# Patient Record
Sex: Female | Born: 1935 | Race: White | Hispanic: No | Smoking: Never smoker
Health system: Southern US, Community
[De-identification: ages and names within clinical notes are randomized; demographics above are authoritative.]

## PROBLEM LIST (undated history)

## (undated) DIAGNOSIS — I2510 Atherosclerotic heart disease of native coronary artery without angina pectoris: Secondary | ICD-10-CM

## (undated) HISTORY — PX: APPENDECTOMY: SHX54

## (undated) HISTORY — DX: Atherosclerotic heart disease of native coronary artery without angina pectoris: I25.10

## (undated) HISTORY — PX: CHOLECYSTECTOMY: SHX55

---

## 2002-10-28 ENCOUNTER — Other Ambulatory Visit: Admission: RE | Admit: 2002-10-28 | Discharge: 2002-10-28 | Payer: Self-pay | Admitting: Family Medicine

## 2008-11-07 ENCOUNTER — Other Ambulatory Visit: Admission: RE | Admit: 2008-11-07 | Discharge: 2008-11-07 | Payer: Self-pay | Admitting: Family Medicine

## 2012-02-11 ENCOUNTER — Other Ambulatory Visit: Payer: Self-pay | Admitting: Family Medicine

## 2012-02-11 ENCOUNTER — Ambulatory Visit
Admission: RE | Admit: 2012-02-11 | Discharge: 2012-02-11 | Disposition: A | Discharge status: Home / Self Care | Payer: PRIVATE HEALTH INSURANCE | Source: Ambulatory Visit | Attending: Family Medicine | Admitting: Family Medicine

## 2012-02-11 DIAGNOSIS — G453 Amaurosis fugax: Secondary | ICD-10-CM

## 2012-02-12 ENCOUNTER — Other Ambulatory Visit: Payer: Self-pay | Admitting: Family Medicine

## 2012-02-12 DIAGNOSIS — E041 Nontoxic single thyroid nodule: Secondary | ICD-10-CM

## 2012-02-18 ENCOUNTER — Other Ambulatory Visit: Payer: Self-pay | Admitting: Family Medicine

## 2012-02-18 DIAGNOSIS — H3400 Transient retinal artery occlusion, unspecified eye: Secondary | ICD-10-CM

## 2012-02-19 ENCOUNTER — Ambulatory Visit
Admission: RE | Admit: 2012-02-19 | Discharge: 2012-02-19 | Disposition: A | Discharge status: Home / Self Care | Payer: PRIVATE HEALTH INSURANCE | Source: Ambulatory Visit | Attending: Family Medicine | Admitting: Family Medicine

## 2012-02-19 DIAGNOSIS — E041 Nontoxic single thyroid nodule: Secondary | ICD-10-CM

## 2012-02-24 ENCOUNTER — Ambulatory Visit
Admission: RE | Admit: 2012-02-24 | Discharge: 2012-02-24 | Disposition: A | Discharge status: Home / Self Care | Payer: PRIVATE HEALTH INSURANCE | Source: Ambulatory Visit | Attending: Family Medicine | Admitting: Family Medicine

## 2012-02-24 DIAGNOSIS — H3400 Transient retinal artery occlusion, unspecified eye: Secondary | ICD-10-CM

## 2012-03-01 ENCOUNTER — Other Ambulatory Visit: Payer: Self-pay | Admitting: Family Medicine

## 2012-03-01 DIAGNOSIS — E041 Nontoxic single thyroid nodule: Secondary | ICD-10-CM

## 2012-03-16 ENCOUNTER — Ambulatory Visit
Admission: RE | Admit: 2012-03-16 | Discharge: 2012-03-16 | Disposition: A | Discharge status: Home / Self Care | Payer: PRIVATE HEALTH INSURANCE | Source: Ambulatory Visit | Attending: Family Medicine | Admitting: Family Medicine

## 2012-03-16 ENCOUNTER — Other Ambulatory Visit (HOSPITAL_COMMUNITY)
Admission: RE | Admit: 2012-03-16 | Discharge: 2012-03-16 | Disposition: A | Discharge status: Home / Self Care | Payer: PRIVATE HEALTH INSURANCE | Source: Ambulatory Visit | Attending: Interventional Radiology | Admitting: Interventional Radiology

## 2012-03-16 DIAGNOSIS — E049 Nontoxic goiter, unspecified: Secondary | ICD-10-CM | POA: Insufficient documentation

## 2012-03-16 DIAGNOSIS — E041 Nontoxic single thyroid nodule: Secondary | ICD-10-CM

## 2014-10-10 ENCOUNTER — Encounter: Payer: Self-pay | Admitting: *Deleted

## 2014-10-11 ENCOUNTER — Encounter: Payer: Self-pay | Admitting: *Deleted

## 2016-12-25 DIAGNOSIS — Z961 Presence of intraocular lens: Secondary | ICD-10-CM | POA: Diagnosis not present

## 2016-12-25 DIAGNOSIS — H43393 Other vitreous opacities, bilateral: Secondary | ICD-10-CM | POA: Diagnosis not present

## 2016-12-25 DIAGNOSIS — H524 Presbyopia: Secondary | ICD-10-CM | POA: Diagnosis not present

## 2016-12-25 DIAGNOSIS — H26492 Other secondary cataract, left eye: Secondary | ICD-10-CM | POA: Diagnosis not present

## 2016-12-25 DIAGNOSIS — H04123 Dry eye syndrome of bilateral lacrimal glands: Secondary | ICD-10-CM | POA: Diagnosis not present

## 2017-02-25 ENCOUNTER — Encounter (HOSPITAL_COMMUNITY): Payer: Self-pay | Admitting: Emergency Medicine

## 2017-02-25 ENCOUNTER — Emergency Department (HOSPITAL_COMMUNITY): Payer: Medicare HMO

## 2017-02-25 ENCOUNTER — Emergency Department (HOSPITAL_COMMUNITY)
Admission: EM | Admit: 2017-02-25 | Discharge: 2017-02-26 | Disposition: A | Discharge status: Home / Self Care | Payer: Medicare HMO | Source: Home / Self Care | Attending: Emergency Medicine | Admitting: Emergency Medicine

## 2017-02-25 DIAGNOSIS — I2510 Atherosclerotic heart disease of native coronary artery without angina pectoris: Secondary | ICD-10-CM | POA: Insufficient documentation

## 2017-02-25 DIAGNOSIS — Y92410 Unspecified street and highway as the place of occurrence of the external cause: Secondary | ICD-10-CM

## 2017-02-25 DIAGNOSIS — Y999 Unspecified external cause status: Secondary | ICD-10-CM

## 2017-02-25 DIAGNOSIS — Y939 Activity, unspecified: Secondary | ICD-10-CM

## 2017-02-25 DIAGNOSIS — M545 Low back pain: Secondary | ICD-10-CM | POA: Diagnosis not present

## 2017-02-25 DIAGNOSIS — M5489 Other dorsalgia: Secondary | ICD-10-CM | POA: Diagnosis not present

## 2017-02-25 DIAGNOSIS — S39012A Strain of muscle, fascia and tendon of lower back, initial encounter: Secondary | ICD-10-CM | POA: Diagnosis not present

## 2017-02-25 DIAGNOSIS — T148XXA Other injury of unspecified body region, initial encounter: Secondary | ICD-10-CM | POA: Diagnosis not present

## 2017-02-25 MED ORDER — HYDROCODONE-ACETAMINOPHEN 5-325 MG PO TABS
1.0000 | ORAL_TABLET | Freq: Once | ORAL | Status: AC
  Administered 2017-02-25: 1 via ORAL
  Filled 2017-02-25: qty 1

## 2017-02-25 MED ORDER — IBUPROFEN 200 MG PO TABS
600.0000 mg | ORAL_TABLET | Freq: Once | ORAL | Status: AC
  Administered 2017-02-25: 600 mg via ORAL
  Filled 2017-02-25: qty 3

## 2017-02-25 MED ORDER — LORAZEPAM 0.5 MG PO TABS
0.5000 mg | ORAL_TABLET | Freq: Once | ORAL | Status: AC
  Administered 2017-02-25: 0.5 mg via ORAL
  Filled 2017-02-25: qty 1
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
## 2017-02-25 NOTE — ED Notes (Signed)
Oxygen saturation 85-88% on room air  Oxygen applied at 2 L/min via [place]

## 2017-02-26 ENCOUNTER — Inpatient Hospital Stay (HOSPITAL_COMMUNITY)
Admission: EM | Admit: 2017-02-26 | Discharge: 2017-03-05 | DRG: 551 | Disposition: A | Discharge status: Skilled Nursing Facility | Payer: Medicare HMO | Attending: Emergency Medicine | Admitting: Emergency Medicine

## 2017-02-26 ENCOUNTER — Encounter (HOSPITAL_COMMUNITY): Payer: Self-pay | Admitting: Emergency Medicine

## 2017-02-26 ENCOUNTER — Emergency Department (HOSPITAL_COMMUNITY): Payer: Medicare HMO

## 2017-02-26 DIAGNOSIS — S32000A Wedge compression fracture of unspecified lumbar vertebra, initial encounter for closed fracture: Secondary | ICD-10-CM

## 2017-02-26 DIAGNOSIS — I2510 Atherosclerotic heart disease of native coronary artery without angina pectoris: Secondary | ICD-10-CM | POA: Diagnosis present

## 2017-02-26 DIAGNOSIS — S39012A Strain of muscle, fascia and tendon of lower back, initial encounter: Secondary | ICD-10-CM | POA: Diagnosis not present

## 2017-02-26 DIAGNOSIS — R918 Other nonspecific abnormal finding of lung field: Secondary | ICD-10-CM | POA: Diagnosis not present

## 2017-02-26 DIAGNOSIS — R52 Pain, unspecified: Secondary | ICD-10-CM | POA: Diagnosis not present

## 2017-02-26 DIAGNOSIS — M79671 Pain in right foot: Secondary | ICD-10-CM | POA: Diagnosis not present

## 2017-02-26 DIAGNOSIS — S32492A Other specified fracture of left acetabulum, initial encounter for closed fracture: Secondary | ICD-10-CM | POA: Diagnosis not present

## 2017-02-26 DIAGNOSIS — S32402A Unspecified fracture of left acetabulum, initial encounter for closed fracture: Secondary | ICD-10-CM | POA: Diagnosis not present

## 2017-02-26 DIAGNOSIS — N390 Urinary tract infection, site not specified: Secondary | ICD-10-CM | POA: Diagnosis not present

## 2017-02-26 DIAGNOSIS — S32039A Unspecified fracture of third lumbar vertebra, initial encounter for closed fracture: Secondary | ICD-10-CM | POA: Diagnosis not present

## 2017-02-26 DIAGNOSIS — M419 Scoliosis, unspecified: Secondary | ICD-10-CM | POA: Diagnosis present

## 2017-02-26 DIAGNOSIS — S3210XD Unspecified fracture of sacrum, subsequent encounter for fracture with routine healing: Secondary | ICD-10-CM

## 2017-02-26 DIAGNOSIS — M545 Low back pain: Secondary | ICD-10-CM | POA: Diagnosis not present

## 2017-02-26 DIAGNOSIS — S3210XA Unspecified fracture of sacrum, initial encounter for closed fracture: Secondary | ICD-10-CM | POA: Diagnosis not present

## 2017-02-26 DIAGNOSIS — M47818 Spondylosis without myelopathy or radiculopathy, sacral and sacrococcygeal region: Secondary | ICD-10-CM | POA: Diagnosis not present

## 2017-02-26 DIAGNOSIS — S3993XA Unspecified injury of pelvis, initial encounter: Secondary | ICD-10-CM | POA: Diagnosis not present

## 2017-02-26 DIAGNOSIS — S32591A Other specified fracture of right pubis, initial encounter for closed fracture: Secondary | ICD-10-CM | POA: Diagnosis present

## 2017-02-26 DIAGNOSIS — D62 Acute posthemorrhagic anemia: Secondary | ICD-10-CM

## 2017-02-26 DIAGNOSIS — M47816 Spondylosis without myelopathy or radiculopathy, lumbar region: Secondary | ICD-10-CM | POA: Diagnosis present

## 2017-02-26 DIAGNOSIS — S32059A Unspecified fracture of fifth lumbar vertebra, initial encounter for closed fracture: Secondary | ICD-10-CM | POA: Diagnosis not present

## 2017-02-26 DIAGNOSIS — S32511A Fracture of superior rim of right pubis, initial encounter for closed fracture: Secondary | ICD-10-CM | POA: Diagnosis not present

## 2017-02-26 DIAGNOSIS — Z880 Allergy status to penicillin: Secondary | ICD-10-CM | POA: Diagnosis not present

## 2017-02-26 DIAGNOSIS — S32030A Wedge compression fracture of third lumbar vertebra, initial encounter for closed fracture: Secondary | ICD-10-CM | POA: Diagnosis not present

## 2017-02-26 DIAGNOSIS — S99911A Unspecified injury of right ankle, initial encounter: Secondary | ICD-10-CM | POA: Diagnosis not present

## 2017-02-26 DIAGNOSIS — Y92410 Unspecified street and highway as the place of occurrence of the external cause: Secondary | ICD-10-CM

## 2017-02-26 DIAGNOSIS — S99921A Unspecified injury of right foot, initial encounter: Secondary | ICD-10-CM | POA: Diagnosis not present

## 2017-02-26 DIAGNOSIS — M25571 Pain in right ankle and joints of right foot: Secondary | ICD-10-CM | POA: Diagnosis not present

## 2017-02-26 DIAGNOSIS — S299XXA Unspecified injury of thorax, initial encounter: Secondary | ICD-10-CM | POA: Diagnosis not present

## 2017-02-26 DIAGNOSIS — M549 Dorsalgia, unspecified: Secondary | ICD-10-CM

## 2017-02-26 LAB — URINALYSIS, ROUTINE W REFLEX MICROSCOPIC
Bilirubin Urine: NEGATIVE
GLUCOSE, UA: NEGATIVE mg/dL
KETONES UR: NEGATIVE mg/dL
Leukocytes, UA: NEGATIVE
Nitrite: POSITIVE — AB
PH: 5 (ref 5.0–8.0)
Protein, ur: NEGATIVE mg/dL
SPECIFIC GRAVITY, URINE: 1.023 (ref 1.005–1.030)

## 2017-02-26 LAB — I-STAT CHEM 8, ED
BUN: 32 mg/dL — ABNORMAL HIGH (ref 6–20)
CALCIUM ION: 1.16 mmol/L (ref 1.15–1.40)
CHLORIDE: 104 mmol/L (ref 101–111)
CREATININE: 0.9 mg/dL (ref 0.44–1.00)
GLUCOSE: 127 mg/dL — AB (ref 65–99)
HCT: 40 % (ref 36.0–46.0)
Hemoglobin: 13.6 g/dL (ref 12.0–15.0)
POTASSIUM: 4.3 mmol/L (ref 3.5–5.1)
Sodium: 137 mmol/L (ref 135–145)
TCO2: 23 mmol/L (ref 0–100)

## 2017-02-26 LAB — CBC WITH DIFFERENTIAL/PLATELET
BASOS ABS: 0 10*3/uL (ref 0.0–0.1)
Basophils Relative: 0 %
EOS PCT: 0 %
Eosinophils Absolute: 0 10*3/uL (ref 0.0–0.7)
HCT: 40.7 % (ref 36.0–46.0)
HEMOGLOBIN: 13.6 g/dL (ref 12.0–15.0)
LYMPHS ABS: 1 10*3/uL (ref 0.7–4.0)
LYMPHS PCT: 8 %
MCH: 30.4 pg (ref 26.0–34.0)
MCHC: 33.4 g/dL (ref 30.0–36.0)
MCV: 90.8 fL (ref 78.0–100.0)
Monocytes Absolute: 0.6 10*3/uL (ref 0.1–1.0)
Monocytes Relative: 4 %
NEUTROS PCT: 88 %
Neutro Abs: 11.5 10*3/uL — ABNORMAL HIGH (ref 1.7–7.7)
Platelets: 190 10*3/uL (ref 150–400)
RBC: 4.48 MIL/uL (ref 3.87–5.11)
RDW: 13.6 % (ref 11.5–15.5)
WBC: 13.1 10*3/uL — ABNORMAL HIGH (ref 4.0–10.5)

## 2017-02-26 LAB — COMPREHENSIVE METABOLIC PANEL
ALK PHOS: 43 U/L (ref 38–126)
ALT: 50 U/L (ref 14–54)
AST: 70 U/L — AB (ref 15–41)
Albumin: 3.6 g/dL (ref 3.5–5.0)
Anion gap: 10 (ref 5–15)
BUN: 30 mg/dL — AB (ref 6–20)
CALCIUM: 9.2 mg/dL (ref 8.9–10.3)
CHLORIDE: 104 mmol/L (ref 101–111)
CO2: 23 mmol/L (ref 22–32)
CREATININE: 0.9 mg/dL (ref 0.44–1.00)
GFR calc Af Amer: >60 mL/min (ref >60)
GFR, EST NON AFRICAN AMERICAN: 58 mL/min — AB (ref >60)
Glucose, Bld: 128 mg/dL — ABNORMAL HIGH (ref 65–99)
Potassium: 4.3 mmol/L (ref 3.5–5.1)
SODIUM: 137 mmol/L (ref 135–145)
Total Bilirubin: 1.3 mg/dL — ABNORMAL HIGH (ref 0.3–1.2)
Total Protein: 7 g/dL (ref 6.5–8.1)

## 2017-02-26 LAB — I-STAT TROPONIN, ED: Troponin i, poc: 0.01 ng/mL (ref 0.00–0.08)

## 2017-02-26 MED ORDER — SODIUM CHLORIDE 0.9 % IV BOLUS (SEPSIS)
1000.0000 mL | Freq: Once | INTRAVENOUS | Status: AC
  Administered 2017-02-26: 1000 mL via INTRAVENOUS

## 2017-02-26 MED ORDER — ONDANSETRON HCL 4 MG/2ML IJ SOLN: 4.0000 mg | Freq: Four times a day (QID) | INTRAMUSCULAR | Status: DC | PRN

## 2017-02-26 MED ORDER — IOPAMIDOL (ISOVUE-300) INJECTION 61%
INTRAVENOUS | Status: AC
Start: 2017-02-26 — End: 2017-02-26
  Administered 2017-02-26: 100 mL
  Filled 2017-02-26: qty 100

## 2017-02-26 MED ORDER — MORPHINE SULFATE (PF) 4 MG/ML IV SOLN
1.0000 mg | INTRAVENOUS | Status: DC | PRN
  Administered 2017-02-26: 1 mg via INTRAVENOUS
  Administered 2017-02-27 (×2): 2 mg via INTRAVENOUS
  Administered 2017-02-27: 1 mg via INTRAVENOUS
  Filled 2017-02-26 (×4): qty 1

## 2017-02-26 MED ORDER — DIPHENHYDRAMINE HCL 50 MG/ML IJ SOLN: 12.5000 mg | Freq: Four times a day (QID) | INTRAMUSCULAR | Status: DC | PRN

## 2017-02-26 MED ORDER — DIPHENHYDRAMINE HCL 12.5 MG/5ML PO ELIX: 12.5000 mg | ORAL_SOLUTION | Freq: Four times a day (QID) | ORAL | Status: DC | PRN

## 2017-02-26 MED ORDER — ONDANSETRON 4 MG PO TBDP
4.0000 mg | ORAL_TABLET | Freq: Four times a day (QID) | ORAL | Status: DC | PRN
  Filled 2017-02-26: qty 1

## 2017-02-26 MED ORDER — FENTANYL CITRATE (PF) 100 MCG/2ML IJ SOLN
25.0000 ug | Freq: Once | INTRAMUSCULAR | Status: AC
  Administered 2017-02-26: 25 ug via INTRAVENOUS
  Filled 2017-02-26: qty 2

## 2017-02-26 MED ORDER — SODIUM CHLORIDE 0.9 % IV SOLN
INTRAVENOUS | Status: DC
  Administered 2017-02-26 – 2017-02-28 (×2): via INTRAVENOUS

## 2017-02-26 MED ORDER — PANTOPRAZOLE SODIUM 40 MG IV SOLR
40.0000 mg | Freq: Every day | INTRAVENOUS | Status: DC
  Administered 2017-02-26 – 2017-02-27 (×2): 40 mg via INTRAVENOUS
  Filled 2017-02-26 (×2): qty 40

## 2017-02-26 MED ORDER — TRAMADOL HCL 50 MG PO TABS: 50.0000 mg | ORAL_TABLET | Freq: Four times a day (QID) | ORAL | 0 refills | Status: DC | PRN

## 2017-02-26 NOTE — ED Notes (Signed)
Attempted to insert foley but had resistance.  Stopped advancing.
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
## 2017-02-26 NOTE — ED Notes (Signed)
Shannon Merritt (daughter) [phone]
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
## 2017-02-26 NOTE — ED Notes (Signed)
Patient transported to MRI 
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
## 2017-02-26 NOTE — ED Notes (Addendum)
Unsuccessful foley placement attempt x2.
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
## 2017-02-26 NOTE — ED Notes (Signed)
Daughter took all the patient's personal belongings home, including cell phone.

## 2017-02-26 NOTE — ED Notes (Signed)
Patient leaving with [facility].  
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
## 2017-02-26 NOTE — ED Notes (Signed)
[facility] called for transport, possible 2hr weight.

## 2017-02-26 NOTE — ED Notes (Signed)
Report given to Rachel Bo, Therapist, sports at [facility].

## 2017-02-26 NOTE — ED Notes (Signed)
Attempted to call report, charge nurse Cybill, RN requested we call back after 7pm.

## 2017-02-27 ENCOUNTER — Inpatient Hospital Stay (HOSPITAL_COMMUNITY): Payer: Medicare HMO

## 2017-02-27 LAB — CBC
HEMATOCRIT: 36.8 % (ref 36.0–46.0)
Hemoglobin: 12.2 g/dL (ref 12.0–15.0)
MCH: 30.1 pg (ref 26.0–34.0)
MCHC: 33.2 g/dL (ref 30.0–36.0)
MCV: 90.9 fL (ref 78.0–100.0)
PLATELETS: 169 10*3/uL (ref 150–400)
RBC: 4.05 MIL/uL (ref 3.87–5.11)
RDW: 13.9 % (ref 11.5–15.5)
WBC: 11.3 10*3/uL — AB (ref 4.0–10.5)

## 2017-02-27 LAB — BASIC METABOLIC PANEL
Anion gap: 9 (ref 5–15)
BUN: 19 mg/dL (ref 6–20)
CALCIUM: 8.2 mg/dL — AB (ref 8.9–10.3)
CO2: 21 mmol/L — ABNORMAL LOW (ref 22–32)
Chloride: 106 mmol/L (ref 101–111)
Creatinine, Ser: 0.74 mg/dL (ref 0.44–1.00)
GFR calc Af Amer: >60 mL/min (ref >60)
GLUCOSE: 123 mg/dL — AB (ref 65–99)
POTASSIUM: 3.9 mmol/L (ref 3.5–5.1)
SODIUM: 136 mmol/L (ref 135–145)

## 2017-02-27 LAB — APTT: APTT: 37 s — AB (ref 24–36)

## 2017-02-27 LAB — PROTIME-INR
INR: 1.18
PROTHROMBIN TIME: 15 s (ref 11.4–15.2)

## 2017-02-27 LAB — MAGNESIUM: MAGNESIUM: 1.9 mg/dL (ref 1.7–2.4)

## 2017-02-27 LAB — MRSA PCR SCREENING: MRSA BY PCR: NEGATIVE

## 2017-02-27 MED ORDER — TRAMADOL HCL 50 MG PO TABS
50.0000 mg | ORAL_TABLET | Freq: Four times a day (QID) | ORAL | Status: DC | PRN
  Administered 2017-02-28 – 2017-03-05 (×12): 50 mg via ORAL
  Filled 2017-02-27 (×12): qty 1

## 2017-02-27 MED ORDER — MULTI-VITAMIN/MINERALS PO TABS: 1.0000 | ORAL_TABLET | Freq: Every day | ORAL | Status: DC

## 2017-02-27 MED ORDER — ADULT MULTIVITAMIN W/MINERALS CH
1.0000 | ORAL_TABLET | Freq: Every day | ORAL | Status: DC
  Administered 2017-02-28 – 2017-03-05 (×6): 1 via ORAL
  Filled 2017-02-27 (×6): qty 1

## 2017-02-27 NOTE — Progress Notes (Signed)
Pt transferred via [place] from [place] to [place] after report.  Pt arrives A&Ox4 and has been instructed on call bell and surroundings.
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
## 2017-02-27 NOTE — Progress Notes (Signed)
Subjective: C/o right ankle/foot pain laterally. Not OOB yet. Some right hip pain.  Objective: Vital signs in last 24 hours: Temp:  [97.8 F (36.6 C)-99.9 F (37.7 C)] 99.4 F (37.4 C) (05/18 0748) Pulse Rate:  [78-117] 117 (05/18 0552) Resp:  [14-20] 18 (05/18 0552) BP: (111-145)/(57-86) 111/86 (05/18 0748) SpO2:  [93 %-100 %] 95 % (05/18 0748) Weight:  [86.8 kg (191 lb 5.8 oz)] 86.8 kg (191 lb 5.8 oz) (05/17 2100)  Intake/Output from previous day: 05/17 0701 - 05/18 0700 In: 2002.5 [I.V.:1002.5; IV Piggyback:1000] Out: 550 [Urine:550] Intake/Output this shift: Total I/O In: 875 [P.O.:800; I.V.:75] Out: 100 [Urine:100]   Recent Labs  02/26/17 1322 02/26/17 1335 02/27/17 0417  HGB 13.6 13.6 12.2    Recent Labs  02/26/17 1322 02/26/17 1335 02/27/17 0417  WBC 13.1*  --  11.3*  RBC 4.48  --  4.05  HCT 40.7 40.0 36.8  PLT 190  --  169    Recent Labs  02/26/17 1322 02/26/17 1335 02/27/17 0417  NA 137 137 136  K 4.3 4.3 3.9  CL 104 104 106  CO2 23  --  21*  BUN 30* 32* 19  CREATININE 0.90 0.90 0.74  GLUCOSE 128* 127* 123*  CALCIUM 9.2  --  8.2*    Recent Labs  02/27/17 0417  INR 1.18  Right foot/ankle exam: Moderate tenderness over cuboid/lateral foot/ankle. FROM Minimal pain with ROM of right and left hips. N-V intact to bilat LE's.    Assessment/Plan: !. Right inferior/superior pubic rami fx's 2.Right sacral fx 3.Non displaced Left acetabular fx 4. Right foot/ankle pain  Plan: Mobilize pt over weekend   WBAT bilat LE's Will order xrays of right foot/ankle. Dr Romero Liner PA-C on call over weekend. If unable to mobilize then will consult Dr Marcelino Scot on Monday.(see Dr Jackalyn Lombard consult note for details)   Erlene Senters 02/27/2017, 9:51 AM

## 2017-02-28 MED ORDER — PANTOPRAZOLE SODIUM 40 MG PO TBEC
40.0000 mg | DELAYED_RELEASE_TABLET | Freq: Every day | ORAL | Status: DC
  Administered 2017-02-28 – 2017-03-04 (×5): 40 mg via ORAL
  Filled 2017-02-28 (×5): qty 1

## 2017-02-28 MED ORDER — ENOXAPARIN SODIUM 40 MG/0.4ML ~~LOC~~ SOLN
40.0000 mg | SUBCUTANEOUS | Status: DC
Start: 2017-02-28 — End: 2017-03-05
  Administered 2017-02-28 – 2017-03-05 (×6): 40 mg via SUBCUTANEOUS
  Filled 2017-02-28 (×6): qty 0.4

## 2017-02-28 MED ORDER — ACETAMINOPHEN 325 MG PO TABS
650.0000 mg | ORAL_TABLET | Freq: Four times a day (QID) | ORAL | Status: DC | PRN
  Administered 2017-03-02: 650 mg via ORAL
  Filled 2017-02-28: qty 2

## 2017-02-28 NOTE — Progress Notes (Signed)
PATIENT ID: Shannon Merritt  MRN: [id number]  DOB/AGE:  Jul 18, 1933 / 83 y.o.        PROGRESS NOTE Subjective:   Patient is alert, oriented, no Nausea, no Vomiting, yes passing gas, no Bowel Movement. Taking PO well. Denies SOB, Chest or Calf Pain. Using Incentive Spirometer, PAS in place. Ambulate WBAT with pt on bedside commode when I entered room, Patient reports pain as 2 on 0-10 scale at rest,     Objective: Vital signs in last 24 hours: Temp:  [98.6 F (37 C)-99.5 F (37.5 C)] 99.1 F (37.3 C) (05/19 0627) Pulse Rate:  [86-98] 86 (05/19 0627) Resp:  [17-20] 20 (05/19 0627) BP: (128-145)/(50-65) 145/65 (05/19 0627) SpO2:  [89 %-99 %] 95 % (05/19 0627)    Intake/Output from previous day: I/O last 3 completed shifts: In: 1877.5 [P.O.:800; I.V.:1077.5] Out: 1180 [Urine:1180]   Intake/Output this shift: No intake/output data recorded.   LABORATORY DATA:  Recent Labs  02/26/17 1322 02/26/17 1335 02/27/17 0417  WBC 13.1*  --  11.3*  HGB 13.6 13.6 12.2  HCT 40.7 40.0 36.8  PLT 190  --  169  NA 137 137 136  K 4.3 4.3 3.9  CL 104 104 106  CO2 23  --  21*  BUN 30* 32* 19  CREATININE 0.90 0.90 0.74  GLUCOSE 128* 127* 123*  INR  --   --  1.18  CALCIUM 9.2  --  8.2*    Examination: Neurologically intact Neurovascular intact Sensation intact distally Intact pulses distally Dorsiflexion/Plantar flexion intact No cellulitis present}  Assessment:   !. Right inferior/superior pubic rami fx's 2.Right sacral fx 3.Non displaced Left acetabular fx 4. Right foot/ankle pain  Plan: Mobilize pt over weekend   WBAT bilat LE's xrays of right foot/ankle were negative Dr Shannon Liner PA-C on call over weekend. If unable to mobilize then will consult Dr Shannon Merritt on Monday.(see Dr Shannon Merritt consult note for details)      [place], Shannon Merritt 02/28/2017, 8:36 AM
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
## 2017-03-01 LAB — CBC
HCT: 34.3 % — ABNORMAL LOW (ref 36.0–46.0)
Hemoglobin: 11.2 g/dL — ABNORMAL LOW (ref 12.0–15.0)
MCH: 29.7 pg (ref 26.0–34.0)
MCHC: 32.7 g/dL (ref 30.0–36.0)
MCV: 91 fL (ref 78.0–100.0)
PLATELETS: 168 10*3/uL (ref 150–400)
RBC: 3.77 MIL/uL — ABNORMAL LOW (ref 3.87–5.11)
RDW: 13.9 % (ref 11.5–15.5)
WBC: 7.1 10*3/uL (ref 4.0–10.5)

## 2017-03-01 LAB — BASIC METABOLIC PANEL
Anion gap: 7 (ref 5–15)
BUN: 22 mg/dL — AB (ref 6–20)
CHLORIDE: 105 mmol/L (ref 101–111)
CO2: 23 mmol/L (ref 22–32)
CREATININE: 0.76 mg/dL (ref 0.44–1.00)
Calcium: 8.2 mg/dL — ABNORMAL LOW (ref 8.9–10.3)
GFR calc Af Amer: >60 mL/min (ref >60)
GLUCOSE: 129 mg/dL — AB (ref 65–99)
Potassium: 3.6 mmol/L (ref 3.5–5.1)
SODIUM: 135 mmol/L (ref 135–145)

## 2017-03-01 MED ORDER — DOCUSATE SODIUM 100 MG PO CAPS
100.0000 mg | ORAL_CAPSULE | Freq: Two times a day (BID) | ORAL | Status: DC
  Administered 2017-03-01 – 2017-03-05 (×8): 100 mg via ORAL
  Filled 2017-03-01 (×8): qty 1

## 2017-03-01 NOTE — Progress Notes (Signed)
PATIENT ID: Shannon Merritt  MRN: [id number]  DOB/AGE:  81-11-1932 / 83 y.o.        PROGRESS NOTE Subjective:   Patient is alert, oriented, no Nausea, no Vomiting, yes passing gas, no Bowel Movement. Taking PO well. Denies SOB, Chest or Calf Pain. Using Incentive Spirometer, PAS in place. Ambulate WBAT with pt walking 5 ft with therapy assist, Patient reports pain as 2 on 0-10 scale,     Objective: Vital signs in last 24 hours: Temp:  [97.8 F (36.6 C)-98.6 F (37 C)] 97.8 F (36.6 C) (05/20 0518) Pulse Rate:  [71-89] 71 (05/20 0518) Resp:  [18-20] 18 (05/20 0518) BP: (112-145)/(46-64) 117/52 (05/20 0518) SpO2:  [92 %-97 %] 97 % (05/20 0518)    Intake/Output from previous day: I/O last 3 completed shifts: In: -  Out: 380 [Urine:380]   Intake/Output this shift: No intake/output data recorded.   LABORATORY DATA:  Recent Labs  02/27/17 0417 03/01/17 0421  WBC 11.3* 7.1  HGB 12.2 11.2*  HCT 36.8 34.3*  PLT 169 168  NA 136 135  K 3.9 3.6  CL 106 105  CO2 21* 23  BUN 19 22*  CREATININE 0.74 0.76  GLUCOSE 123* 129*  INR 1.18  --   CALCIUM 8.2* 8.2*    Examination: Neurologically intact Neurovascular intact Sensation intact distally Intact pulses distally Dorsiflexion/Plantar flexion intact No cellulitis present}  Assessment:    1. Right inferior/superior pubic rami fx's 2.Right sacral fx 3.Non displaced Left acetabular fx 4. Right foot/ankle pain  Plan: Mobilize pt over weekend WBAT bilat LE's xrays of right foot/ankle were negative Dr Romero Liner PA-C on call over weekend. If unable to mobilize then will consult Dr Marcelino Scot on Monday.(see Dr Jackalyn Lombard consult note for details)    PHILLIPS, ERIC R 03/01/2017, 10:41 AM

## 2017-03-01 NOTE — Progress Notes (Signed)
Rehab Admissions Coordinator Note:  Patient was screened by Retta Diones for appropriateness for an Inpatient Acute Rehab Consult.  At this time, we are recommending Inpatient Rehab consult.  Retta Diones 03/01/2017, 7:11 PM  I can be reached at [phone].
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
## 2017-03-02 DIAGNOSIS — R52 Pain, unspecified: Secondary | ICD-10-CM

## 2017-03-02 DIAGNOSIS — I2510 Atherosclerotic heart disease of native coronary artery without angina pectoris: Secondary | ICD-10-CM

## 2017-03-02 DIAGNOSIS — M25571 Pain in right ankle and joints of right foot: Secondary | ICD-10-CM

## 2017-03-02 DIAGNOSIS — D62 Acute posthemorrhagic anemia: Secondary | ICD-10-CM

## 2017-03-02 DIAGNOSIS — S3210XD Unspecified fracture of sacrum, subsequent encounter for fracture with routine healing: Secondary | ICD-10-CM

## 2017-03-02 DIAGNOSIS — M79671 Pain in right foot: Secondary | ICD-10-CM

## 2017-03-02 LAB — URINALYSIS, ROUTINE W REFLEX MICROSCOPIC
BILIRUBIN URINE: NEGATIVE
Glucose, UA: NEGATIVE mg/dL
Hgb urine dipstick: NEGATIVE
KETONES UR: NEGATIVE mg/dL
LEUKOCYTES UA: NEGATIVE
Nitrite: POSITIVE — AB
Protein, ur: NEGATIVE mg/dL
SPECIFIC GRAVITY, URINE: 1.01 (ref 1.005–1.030)
pH: 5 (ref 5.0–8.0)

## 2017-03-02 MED ORDER — BISACODYL 10 MG RE SUPP
10.0000 mg | Freq: Every day | RECTAL | Status: DC | PRN
  Administered 2017-03-02: 10 mg via RECTAL
  Filled 2017-03-02: qty 1
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
## 2017-03-03 MED ORDER — ACETAMINOPHEN 500 MG PO TABS
1000.0000 mg | ORAL_TABLET | Freq: Three times a day (TID) | ORAL | Status: DC
Start: 2017-03-03 — End: 2017-03-05
  Administered 2017-03-03 – 2017-03-05 (×6): 1000 mg via ORAL
  Filled 2017-03-03 (×6): qty 2
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
## 2017-03-03 NOTE — Care Management Important Message (Signed)
Important Message  Patient Details  Name: Shannon Merritt MRN: [id number] Date of Birth: 1933-04-27   Medicare Important Message Given:  Yes    [name] Montine Circle 03/03/2017, 9:42 AM

## 2017-03-03 NOTE — Progress Notes (Signed)
Inpatient Rehabilitation  I met with the patient at the bedside to discuss the recommendation for IP Rehab.  I provided informational booklets and answered pt's questions.  Pt. would like to come to CIR for her rehab recovery.  I have initiated insurance authorization process and await [employer] decision.  I will follow up when I have heard from [facility].  Please call if questions.  [place] Admissions Coordinator Cell [phone] Office [phone]
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
## 2017-03-04 MED ORDER — CIPROFLOXACIN HCL 500 MG PO TABS
500.0000 mg | ORAL_TABLET | Freq: Two times a day (BID) | ORAL | Status: DC
  Administered 2017-03-04 – 2017-03-05 (×3): 500 mg via ORAL
  Filled 2017-03-04 (×3): qty 1

## 2017-03-04 NOTE — Progress Notes (Signed)
I received an insurance denial from [employer] for IP rehab. I have notified pt. and she is accepting of the decision.  [employer] case manager stated pt. had been approved for 7-10 days at [facility] and that SNF needs to submit for auth.  I will sign off.    [place] Admissions Coordinator Cell [phone] Office [phone]

## 2017-03-04 NOTE — Progress Notes (Signed)
Inpatient Rehabilitation  I met with the patient and her daughter at the bedside.  I brought daughter up to speed on current status of pending   insurance approval from [employer].  Daughter is fully supportive of IP rehab.  Will update the team as soon as a decision from [facility] is provided.  Please call if questions.  [place] Admissions Coordinator Cell [phone] Office [phone]

## 2017-03-04 NOTE — Progress Notes (Signed)
CSW alerted by [facility] that insurance denial is possible for CIR. CSW discussed with pt and pt's daughter to review bed offers for SNF placement. CSW explained insurance authorization approval process, and the need to select a facility. Pt's daughter agreed to research options and discuss with pt, and confirm selection of facility to begin insurance authorization if denial of CIR happens in the morning.   CSW following to assist with facilitating discharge to SNF, if needed.  Laveda Abbe LCSW [phone]

## 2017-03-04 NOTE — Progress Notes (Signed)
I was able to speak with [facility] case manager Stanton Kidney.  Pt's case has been sent to the medical director for a decision and while we do not have a definitive decision at this point, Stanton Kidney expects we will get a denial.  I have updated the patient that we await a final determination but may not get approval.  She is understanding and agreeable to SNF placement if necessary.  I have updated Ellan Lambert, RNCM of the above.  Plan will be for Gunnar Fusi to follow up tomorrow.  Please call if questions.  [place] Admissions Coordinator Cell [phone] Office [phone]
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
## 2017-03-04 NOTE — Progress Notes (Signed)
Trauma Service Note  Subjective: Patient doing well.  Possibly to Rehab soon.   Objective: Vital signs in last 24 hours: Temp:  [98.2 F (36.8 C)-98.9 F (37.2 C)] 98.2 F (36.8 C) (05/23 0511) Pulse Rate:  [91-104] 97 (05/23 0511) Resp:  [18-20] 18 (05/23 0511) BP: (120-137)/(63-69) 132/69 (05/23 0511) SpO2:  [93 %-96 %] 93 % (05/23 0511) Last BM Date: 03/03/17  Intake/Output from previous day: No intake/output data recorded. Intake/Output this shift: No intake/output data recorded.  General: No acute distress  Lungs: Clear  Abd: Benign  Extremities: Intact  Neuro: No changes  Lab Results: CBC  No results for input(s): WBC, HGB, HCT, PLT in the last 72 hours. BMET No results for input(s): NA, K, CL, CO2, GLUCOSE, BUN, CREATININE, CALCIUM in the last 72 hours. PT/INR No results for input(s): LABPROT, INR in the last 72 hours. ABG No results for input(s): PHART, HCO3 in the last 72 hours.  Invalid input(s): PCO2, PO2  Studies/Results: No results found.  Anti-infectives: Anti-infectives    None      Assessment/Plan: s/p  Disposition is the limiting factur currently.  LOS: 6 days   Kathryne Eriksson. Dahlia Bailiff, MD, FACS [phone] Trauma Surgeon 03/04/2017

## 2017-03-05 DIAGNOSIS — K5903 Drug induced constipation: Secondary | ICD-10-CM | POA: Diagnosis not present

## 2017-03-05 DIAGNOSIS — S32009S Unspecified fracture of unspecified lumbar vertebra, sequela: Secondary | ICD-10-CM | POA: Diagnosis not present

## 2017-03-05 DIAGNOSIS — D62 Acute posthemorrhagic anemia: Secondary | ICD-10-CM | POA: Diagnosis not present

## 2017-03-05 DIAGNOSIS — R52 Pain, unspecified: Secondary | ICD-10-CM | POA: Diagnosis not present

## 2017-03-05 DIAGNOSIS — S3210XD Unspecified fracture of sacrum, subsequent encounter for fracture with routine healing: Secondary | ICD-10-CM | POA: Diagnosis not present

## 2017-03-05 DIAGNOSIS — S3210XA Unspecified fracture of sacrum, initial encounter for closed fracture: Secondary | ICD-10-CM | POA: Diagnosis not present

## 2017-03-05 DIAGNOSIS — M79671 Pain in right foot: Secondary | ICD-10-CM | POA: Diagnosis not present

## 2017-03-05 DIAGNOSIS — S32591S Other specified fracture of right pubis, sequela: Secondary | ICD-10-CM | POA: Diagnosis not present

## 2017-03-05 DIAGNOSIS — S32020S Wedge compression fracture of second lumbar vertebra, sequela: Secondary | ICD-10-CM | POA: Diagnosis not present

## 2017-03-05 DIAGNOSIS — I2510 Atherosclerotic heart disease of native coronary artery without angina pectoris: Secondary | ICD-10-CM | POA: Diagnosis not present

## 2017-03-05 DIAGNOSIS — S39012D Strain of muscle, fascia and tendon of lower back, subsequent encounter: Secondary | ICD-10-CM | POA: Diagnosis not present

## 2017-03-05 DIAGNOSIS — D649 Anemia, unspecified: Secondary | ICD-10-CM | POA: Diagnosis not present

## 2017-03-05 DIAGNOSIS — S32039A Unspecified fracture of third lumbar vertebra, initial encounter for closed fracture: Secondary | ICD-10-CM | POA: Diagnosis not present

## 2017-03-05 DIAGNOSIS — R2689 Other abnormalities of gait and mobility: Secondary | ICD-10-CM | POA: Diagnosis not present

## 2017-03-05 DIAGNOSIS — M25571 Pain in right ankle and joints of right foot: Secondary | ICD-10-CM | POA: Diagnosis not present

## 2017-03-05 DIAGNOSIS — K219 Gastro-esophageal reflux disease without esophagitis: Secondary | ICD-10-CM | POA: Diagnosis not present

## 2017-03-05 DIAGNOSIS — S32592S Other specified fracture of left pubis, sequela: Secondary | ICD-10-CM | POA: Diagnosis not present

## 2017-03-05 DIAGNOSIS — R740 Nonspecific elevation of levels of transaminase and lactic acid dehydrogenase [LDH]: Secondary | ICD-10-CM | POA: Diagnosis not present

## 2017-03-05 DIAGNOSIS — T07XXXA Unspecified multiple injuries, initial encounter: Secondary | ICD-10-CM | POA: Diagnosis not present

## 2017-03-05 LAB — URINE CULTURE

## 2017-03-05 MED ORDER — PANTOPRAZOLE SODIUM 40 MG PO TBEC: 40.0000 mg | DELAYED_RELEASE_TABLET | Freq: Every day | ORAL | Status: AC

## 2017-03-05 MED ORDER — DIPHENHYDRAMINE HCL 12.5 MG/5ML PO ELIX: 12.5000 mg | ORAL_SOLUTION | Freq: Four times a day (QID) | ORAL | 0 refills | Status: DC | PRN

## 2017-03-05 MED ORDER — BISACODYL 10 MG RE SUPP: 10.0000 mg | Freq: Every day | RECTAL | 0 refills | Status: AC | PRN

## 2017-03-05 MED ORDER — ONDANSETRON 4 MG PO TBDP: 4.0000 mg | ORAL_TABLET | Freq: Four times a day (QID) | ORAL | 0 refills | Status: AC | PRN

## 2017-03-05 MED ORDER — TRAMADOL HCL 50 MG PO TABS: 50.0000 mg | ORAL_TABLET | Freq: Four times a day (QID) | ORAL | 0 refills | Status: AC | PRN

## 2017-03-05 MED ORDER — CIPROFLOXACIN HCL 500 MG PO TABS: 500.0000 mg | ORAL_TABLET | Freq: Two times a day (BID) | ORAL | Status: AC

## 2017-03-05 MED ORDER — DOCUSATE SODIUM 100 MG PO CAPS: 100.0000 mg | ORAL_CAPSULE | Freq: Two times a day (BID) | ORAL | 0 refills | Status: AC

## 2017-03-05 MED ORDER — ACETAMINOPHEN 500 MG PO TABS: 1000.0000 mg | ORAL_TABLET | Freq: Three times a day (TID) | ORAL | 0 refills | Status: AC | PRN
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
## 2017-03-05 NOTE — Progress Notes (Addendum)
Discharge to: [facility] Anticipated discharge date: 03/05/17 Family notified: Yes, daughter, by phone Transportation by: [facility]  Report #: [phone]  [place] signing off.  Laveda Abbe LCSW [phone]

## 2017-03-05 NOTE — Progress Notes (Addendum)
Patient is being discharge to [facility]. Report was given and patient is on her way there via ambulance

## 2017-03-06 ENCOUNTER — Non-Acute Institutional Stay (SKILLED_NURSING_FACILITY): Payer: Medicare HMO | Admitting: Adult Health

## 2017-03-06 ENCOUNTER — Encounter: Payer: Self-pay | Admitting: Adult Health

## 2017-03-06 DIAGNOSIS — S32592S Other specified fracture of left pubis, sequela: Secondary | ICD-10-CM | POA: Insufficient documentation

## 2017-03-06 DIAGNOSIS — S3210XD Unspecified fracture of sacrum, subsequent encounter for fracture with routine healing: Secondary | ICD-10-CM

## 2017-03-06 DIAGNOSIS — S32009S Unspecified fracture of unspecified lumbar vertebra, sequela: Secondary | ICD-10-CM | POA: Diagnosis not present

## 2017-03-06 DIAGNOSIS — K5903 Drug induced constipation: Secondary | ICD-10-CM

## 2017-03-06 DIAGNOSIS — S32020S Wedge compression fracture of second lumbar vertebra, sequela: Secondary | ICD-10-CM | POA: Diagnosis not present

## 2017-03-06 DIAGNOSIS — S32591S Other specified fracture of right pubis, sequela: Secondary | ICD-10-CM | POA: Diagnosis not present
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
## 2017-03-12 ENCOUNTER — Non-Acute Institutional Stay (SKILLED_NURSING_FACILITY): Payer: Medicare HMO | Admitting: Internal Medicine

## 2017-03-12 ENCOUNTER — Encounter: Payer: Self-pay | Admitting: Internal Medicine

## 2017-03-12 DIAGNOSIS — D649 Anemia, unspecified: Secondary | ICD-10-CM

## 2017-03-12 DIAGNOSIS — K219 Gastro-esophageal reflux disease without esophagitis: Secondary | ICD-10-CM | POA: Diagnosis not present

## 2017-03-12 DIAGNOSIS — I2510 Atherosclerotic heart disease of native coronary artery without angina pectoris: Secondary | ICD-10-CM | POA: Diagnosis not present

## 2017-03-12 DIAGNOSIS — K5903 Drug induced constipation: Secondary | ICD-10-CM

## 2017-03-12 DIAGNOSIS — S39012D Strain of muscle, fascia and tendon of lower back, subsequent encounter: Secondary | ICD-10-CM | POA: Diagnosis not present

## 2017-03-12 DIAGNOSIS — R7401 Elevation of levels of liver transaminase levels: Secondary | ICD-10-CM

## 2017-03-12 DIAGNOSIS — T07XXXA Unspecified multiple injuries, initial encounter: Secondary | ICD-10-CM | POA: Diagnosis not present

## 2017-03-12 DIAGNOSIS — R740 Nonspecific elevation of levels of transaminase and lactic acid dehydrogenase [LDH]: Secondary | ICD-10-CM
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
## 2017-03-13 DIAGNOSIS — D62 Acute posthemorrhagic anemia: Secondary | ICD-10-CM | POA: Diagnosis not present

## 2017-03-13 DIAGNOSIS — R52 Pain, unspecified: Secondary | ICD-10-CM | POA: Diagnosis not present

## 2017-03-13 DIAGNOSIS — I2510 Atherosclerotic heart disease of native coronary artery without angina pectoris: Secondary | ICD-10-CM | POA: Diagnosis not present

## 2017-03-13 DIAGNOSIS — M79671 Pain in right foot: Secondary | ICD-10-CM | POA: Diagnosis not present

## 2017-03-13 DIAGNOSIS — M25571 Pain in right ankle and joints of right foot: Secondary | ICD-10-CM | POA: Diagnosis not present

## 2017-03-13 DIAGNOSIS — S3210XD Unspecified fracture of sacrum, subsequent encounter for fracture with routine healing: Secondary | ICD-10-CM | POA: Diagnosis not present

## 2017-03-13 LAB — HEPATIC FUNCTION PANEL
ALT: 25 (ref 7–35)
AST: 26 (ref 13–35)
Alkaline Phosphatase: 258 — AB (ref 25–125)
BILIRUBIN, TOTAL: 0.5

## 2017-03-17 DIAGNOSIS — R2689 Other abnormalities of gait and mobility: Secondary | ICD-10-CM | POA: Diagnosis not present

## 2017-03-19 DIAGNOSIS — R2689 Other abnormalities of gait and mobility: Secondary | ICD-10-CM | POA: Diagnosis not present

## 2017-03-19 LAB — HEPATIC FUNCTION PANEL
ALK PHOS: 221 — AB (ref 25–125)
ALT: 14 (ref 7–35)
AST: 19 (ref 13–35)
BILIRUBIN, TOTAL: 0.5

## 2017-03-19 LAB — BASIC METABOLIC PANEL
BUN: 24 — AB (ref 4–21)
Creatinine: 0.7 (ref 0.5–1.1)
Glucose: 101
Potassium: 4.2 (ref 3.4–5.3)
Sodium: 140 (ref 137–147)

## 2017-03-24 ENCOUNTER — Non-Acute Institutional Stay (SKILLED_NURSING_FACILITY): Payer: Medicare HMO | Admitting: Adult Health

## 2017-03-24 ENCOUNTER — Encounter: Payer: Self-pay | Admitting: Adult Health

## 2017-03-24 DIAGNOSIS — S3210XD Unspecified fracture of sacrum, subsequent encounter for fracture with routine healing: Secondary | ICD-10-CM

## 2017-03-24 DIAGNOSIS — S32020S Wedge compression fracture of second lumbar vertebra, sequela: Secondary | ICD-10-CM | POA: Diagnosis not present

## 2017-03-24 DIAGNOSIS — S32592S Other specified fracture of left pubis, sequela: Secondary | ICD-10-CM

## 2017-03-24 DIAGNOSIS — S32591S Other specified fracture of right pubis, sequela: Secondary | ICD-10-CM
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
## 2017-03-27 DIAGNOSIS — I2510 Atherosclerotic heart disease of native coronary artery without angina pectoris: Secondary | ICD-10-CM | POA: Diagnosis not present

## 2017-03-27 DIAGNOSIS — M79671 Pain in right foot: Secondary | ICD-10-CM | POA: Diagnosis not present

## 2017-03-27 DIAGNOSIS — S32030D Wedge compression fracture of third lumbar vertebra, subsequent encounter for fracture with routine healing: Secondary | ICD-10-CM | POA: Diagnosis not present

## 2017-03-27 DIAGNOSIS — M25571 Pain in right ankle and joints of right foot: Secondary | ICD-10-CM | POA: Diagnosis not present

## 2017-03-30 DIAGNOSIS — S32030D Wedge compression fracture of third lumbar vertebra, subsequent encounter for fracture with routine healing: Secondary | ICD-10-CM | POA: Diagnosis not present

## 2017-03-30 DIAGNOSIS — M25571 Pain in right ankle and joints of right foot: Secondary | ICD-10-CM | POA: Diagnosis not present

## 2017-03-30 DIAGNOSIS — I2510 Atherosclerotic heart disease of native coronary artery without angina pectoris: Secondary | ICD-10-CM | POA: Diagnosis not present

## 2017-03-30 DIAGNOSIS — S3210XD Unspecified fracture of sacrum, subsequent encounter for fracture with routine healing: Secondary | ICD-10-CM | POA: Diagnosis not present

## 2017-03-30 DIAGNOSIS — M79671 Pain in right foot: Secondary | ICD-10-CM | POA: Diagnosis not present

## 2017-03-30 DIAGNOSIS — R52 Pain, unspecified: Secondary | ICD-10-CM | POA: Diagnosis not present

## 2017-03-31 DIAGNOSIS — M79671 Pain in right foot: Secondary | ICD-10-CM | POA: Diagnosis not present

## 2017-03-31 DIAGNOSIS — S32030D Wedge compression fracture of third lumbar vertebra, subsequent encounter for fracture with routine healing: Secondary | ICD-10-CM | POA: Diagnosis not present

## 2017-03-31 DIAGNOSIS — M25571 Pain in right ankle and joints of right foot: Secondary | ICD-10-CM | POA: Diagnosis not present

## 2017-03-31 DIAGNOSIS — I2510 Atherosclerotic heart disease of native coronary artery without angina pectoris: Secondary | ICD-10-CM | POA: Diagnosis not present

## 2017-04-01 DIAGNOSIS — M25571 Pain in right ankle and joints of right foot: Secondary | ICD-10-CM | POA: Diagnosis not present

## 2017-04-01 DIAGNOSIS — M79671 Pain in right foot: Secondary | ICD-10-CM | POA: Diagnosis not present

## 2017-04-01 DIAGNOSIS — S32030D Wedge compression fracture of third lumbar vertebra, subsequent encounter for fracture with routine healing: Secondary | ICD-10-CM | POA: Diagnosis not present

## 2017-04-01 DIAGNOSIS — I2510 Atherosclerotic heart disease of native coronary artery without angina pectoris: Secondary | ICD-10-CM | POA: Diagnosis not present

## 2017-04-06 DIAGNOSIS — S32000A Wedge compression fracture of unspecified lumbar vertebra, initial encounter for closed fracture: Secondary | ICD-10-CM | POA: Diagnosis not present

## 2017-04-06 DIAGNOSIS — M79671 Pain in right foot: Secondary | ICD-10-CM | POA: Diagnosis not present

## 2017-04-06 DIAGNOSIS — M25571 Pain in right ankle and joints of right foot: Secondary | ICD-10-CM | POA: Diagnosis not present

## 2017-04-06 DIAGNOSIS — I2510 Atherosclerotic heart disease of native coronary artery without angina pectoris: Secondary | ICD-10-CM | POA: Diagnosis not present

## 2017-04-06 DIAGNOSIS — S32030D Wedge compression fracture of third lumbar vertebra, subsequent encounter for fracture with routine healing: Secondary | ICD-10-CM | POA: Diagnosis not present

## 2017-04-07 DIAGNOSIS — I2510 Atherosclerotic heart disease of native coronary artery without angina pectoris: Secondary | ICD-10-CM | POA: Diagnosis not present

## 2017-04-07 DIAGNOSIS — M79671 Pain in right foot: Secondary | ICD-10-CM | POA: Diagnosis not present

## 2017-04-07 DIAGNOSIS — S32030D Wedge compression fracture of third lumbar vertebra, subsequent encounter for fracture with routine healing: Secondary | ICD-10-CM | POA: Diagnosis not present

## 2017-04-07 DIAGNOSIS — M25571 Pain in right ankle and joints of right foot: Secondary | ICD-10-CM | POA: Diagnosis not present

## 2017-04-08 DIAGNOSIS — M25571 Pain in right ankle and joints of right foot: Secondary | ICD-10-CM | POA: Diagnosis not present

## 2017-04-08 DIAGNOSIS — M79671 Pain in right foot: Secondary | ICD-10-CM | POA: Diagnosis not present

## 2017-04-08 DIAGNOSIS — I2510 Atherosclerotic heart disease of native coronary artery without angina pectoris: Secondary | ICD-10-CM | POA: Diagnosis not present

## 2017-04-08 DIAGNOSIS — S32030D Wedge compression fracture of third lumbar vertebra, subsequent encounter for fracture with routine healing: Secondary | ICD-10-CM | POA: Diagnosis not present

## 2017-04-10 DIAGNOSIS — M25571 Pain in right ankle and joints of right foot: Secondary | ICD-10-CM | POA: Diagnosis not present

## 2017-04-10 DIAGNOSIS — S32030D Wedge compression fracture of third lumbar vertebra, subsequent encounter for fracture with routine healing: Secondary | ICD-10-CM | POA: Diagnosis not present

## 2017-04-10 DIAGNOSIS — I2510 Atherosclerotic heart disease of native coronary artery without angina pectoris: Secondary | ICD-10-CM | POA: Diagnosis not present

## 2017-04-10 DIAGNOSIS — M79671 Pain in right foot: Secondary | ICD-10-CM | POA: Diagnosis not present

## 2017-04-13 DIAGNOSIS — M79671 Pain in right foot: Secondary | ICD-10-CM | POA: Diagnosis not present

## 2017-04-13 DIAGNOSIS — I2510 Atherosclerotic heart disease of native coronary artery without angina pectoris: Secondary | ICD-10-CM | POA: Diagnosis not present

## 2017-04-13 DIAGNOSIS — S32030D Wedge compression fracture of third lumbar vertebra, subsequent encounter for fracture with routine healing: Secondary | ICD-10-CM | POA: Diagnosis not present

## 2017-04-13 DIAGNOSIS — M25571 Pain in right ankle and joints of right foot: Secondary | ICD-10-CM | POA: Diagnosis not present

## 2017-04-16 DIAGNOSIS — M79671 Pain in right foot: Secondary | ICD-10-CM | POA: Diagnosis not present

## 2017-04-16 DIAGNOSIS — I2510 Atherosclerotic heart disease of native coronary artery without angina pectoris: Secondary | ICD-10-CM | POA: Diagnosis not present

## 2017-04-16 DIAGNOSIS — M25571 Pain in right ankle and joints of right foot: Secondary | ICD-10-CM | POA: Diagnosis not present

## 2017-04-16 DIAGNOSIS — S32030D Wedge compression fracture of third lumbar vertebra, subsequent encounter for fracture with routine healing: Secondary | ICD-10-CM | POA: Diagnosis not present

## 2017-04-17 DIAGNOSIS — S32030D Wedge compression fracture of third lumbar vertebra, subsequent encounter for fracture with routine healing: Secondary | ICD-10-CM | POA: Diagnosis not present

## 2017-04-17 DIAGNOSIS — M25571 Pain in right ankle and joints of right foot: Secondary | ICD-10-CM | POA: Diagnosis not present

## 2017-04-17 DIAGNOSIS — I2510 Atherosclerotic heart disease of native coronary artery without angina pectoris: Secondary | ICD-10-CM | POA: Diagnosis not present

## 2017-04-17 DIAGNOSIS — M79671 Pain in right foot: Secondary | ICD-10-CM | POA: Diagnosis not present

## 2017-04-20 DIAGNOSIS — M79671 Pain in right foot: Secondary | ICD-10-CM | POA: Diagnosis not present

## 2017-04-20 DIAGNOSIS — S32030D Wedge compression fracture of third lumbar vertebra, subsequent encounter for fracture with routine healing: Secondary | ICD-10-CM | POA: Diagnosis not present

## 2017-04-20 DIAGNOSIS — M25571 Pain in right ankle and joints of right foot: Secondary | ICD-10-CM | POA: Diagnosis not present

## 2017-04-20 DIAGNOSIS — I2510 Atherosclerotic heart disease of native coronary artery without angina pectoris: Secondary | ICD-10-CM | POA: Diagnosis not present

## 2017-04-22 DIAGNOSIS — M79671 Pain in right foot: Secondary | ICD-10-CM | POA: Diagnosis not present

## 2017-04-22 DIAGNOSIS — I2510 Atherosclerotic heart disease of native coronary artery without angina pectoris: Secondary | ICD-10-CM | POA: Diagnosis not present

## 2017-04-22 DIAGNOSIS — S32030D Wedge compression fracture of third lumbar vertebra, subsequent encounter for fracture with routine healing: Secondary | ICD-10-CM | POA: Diagnosis not present

## 2017-04-22 DIAGNOSIS — M25571 Pain in right ankle and joints of right foot: Secondary | ICD-10-CM | POA: Diagnosis not present

## 2017-04-27 DIAGNOSIS — S32030D Wedge compression fracture of third lumbar vertebra, subsequent encounter for fracture with routine healing: Secondary | ICD-10-CM | POA: Diagnosis not present

## 2017-04-27 DIAGNOSIS — M25571 Pain in right ankle and joints of right foot: Secondary | ICD-10-CM | POA: Diagnosis not present

## 2017-04-27 DIAGNOSIS — M79671 Pain in right foot: Secondary | ICD-10-CM | POA: Diagnosis not present

## 2017-04-27 DIAGNOSIS — I2510 Atherosclerotic heart disease of native coronary artery without angina pectoris: Secondary | ICD-10-CM | POA: Diagnosis not present

## 2017-04-29 DIAGNOSIS — S32030D Wedge compression fracture of third lumbar vertebra, subsequent encounter for fracture with routine healing: Secondary | ICD-10-CM | POA: Diagnosis not present

## 2017-04-29 DIAGNOSIS — M25571 Pain in right ankle and joints of right foot: Secondary | ICD-10-CM | POA: Diagnosis not present

## 2017-04-29 DIAGNOSIS — I2510 Atherosclerotic heart disease of native coronary artery without angina pectoris: Secondary | ICD-10-CM | POA: Diagnosis not present

## 2017-04-29 DIAGNOSIS — S3210XD Unspecified fracture of sacrum, subsequent encounter for fracture with routine healing: Secondary | ICD-10-CM | POA: Diagnosis not present

## 2017-04-29 DIAGNOSIS — M79671 Pain in right foot: Secondary | ICD-10-CM | POA: Diagnosis not present

## 2017-04-29 DIAGNOSIS — R52 Pain, unspecified: Secondary | ICD-10-CM | POA: Diagnosis not present

## 2017-05-04 DIAGNOSIS — S32030D Wedge compression fracture of third lumbar vertebra, subsequent encounter for fracture with routine healing: Secondary | ICD-10-CM | POA: Diagnosis not present

## 2017-05-04 DIAGNOSIS — M25571 Pain in right ankle and joints of right foot: Secondary | ICD-10-CM | POA: Diagnosis not present

## 2017-05-04 DIAGNOSIS — I2510 Atherosclerotic heart disease of native coronary artery without angina pectoris: Secondary | ICD-10-CM | POA: Diagnosis not present

## 2017-05-04 DIAGNOSIS — M79671 Pain in right foot: Secondary | ICD-10-CM | POA: Diagnosis not present

## 2017-05-06 DIAGNOSIS — M79671 Pain in right foot: Secondary | ICD-10-CM | POA: Diagnosis not present

## 2017-05-06 DIAGNOSIS — M25571 Pain in right ankle and joints of right foot: Secondary | ICD-10-CM | POA: Diagnosis not present

## 2017-05-06 DIAGNOSIS — I2510 Atherosclerotic heart disease of native coronary artery without angina pectoris: Secondary | ICD-10-CM | POA: Diagnosis not present

## 2017-05-06 DIAGNOSIS — S32030D Wedge compression fracture of third lumbar vertebra, subsequent encounter for fracture with routine healing: Secondary | ICD-10-CM | POA: Diagnosis not present

## 2017-05-12 DIAGNOSIS — M25571 Pain in right ankle and joints of right foot: Secondary | ICD-10-CM | POA: Diagnosis not present

## 2017-05-12 DIAGNOSIS — M79671 Pain in right foot: Secondary | ICD-10-CM | POA: Diagnosis not present

## 2017-05-12 DIAGNOSIS — I2510 Atherosclerotic heart disease of native coronary artery without angina pectoris: Secondary | ICD-10-CM | POA: Diagnosis not present

## 2017-05-12 DIAGNOSIS — S32030D Wedge compression fracture of third lumbar vertebra, subsequent encounter for fracture with routine healing: Secondary | ICD-10-CM | POA: Diagnosis not present

## 2017-05-25 DIAGNOSIS — Z780 Asymptomatic menopausal state: Secondary | ICD-10-CM | POA: Diagnosis not present

## 2017-05-25 DIAGNOSIS — S32000A Wedge compression fracture of unspecified lumbar vertebra, initial encounter for closed fracture: Secondary | ICD-10-CM | POA: Diagnosis not present

## 2017-05-30 DIAGNOSIS — R52 Pain, unspecified: Secondary | ICD-10-CM | POA: Diagnosis not present

## 2017-05-30 DIAGNOSIS — M79671 Pain in right foot: Secondary | ICD-10-CM | POA: Diagnosis not present

## 2017-05-30 DIAGNOSIS — S3210XD Unspecified fracture of sacrum, subsequent encounter for fracture with routine healing: Secondary | ICD-10-CM | POA: Diagnosis not present

## 2017-05-30 DIAGNOSIS — M25571 Pain in right ankle and joints of right foot: Secondary | ICD-10-CM | POA: Diagnosis not present

## 2017-05-30 DIAGNOSIS — I2510 Atherosclerotic heart disease of native coronary artery without angina pectoris: Secondary | ICD-10-CM | POA: Diagnosis not present

## 2017-06-30 DIAGNOSIS — S3210XD Unspecified fracture of sacrum, subsequent encounter for fracture with routine healing: Secondary | ICD-10-CM | POA: Diagnosis not present

## 2017-06-30 DIAGNOSIS — I2510 Atherosclerotic heart disease of native coronary artery without angina pectoris: Secondary | ICD-10-CM | POA: Diagnosis not present

## 2017-06-30 DIAGNOSIS — R52 Pain, unspecified: Secondary | ICD-10-CM | POA: Diagnosis not present

## 2017-06-30 DIAGNOSIS — M25571 Pain in right ankle and joints of right foot: Secondary | ICD-10-CM | POA: Diagnosis not present

## 2017-06-30 DIAGNOSIS — M79671 Pain in right foot: Secondary | ICD-10-CM | POA: Diagnosis not present

## 2017-07-10 DIAGNOSIS — Z029 Encounter for administrative examinations, unspecified: Secondary | ICD-10-CM
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
## 2017-07-30 DIAGNOSIS — S3210XD Unspecified fracture of sacrum, subsequent encounter for fracture with routine healing: Secondary | ICD-10-CM | POA: Diagnosis not present

## 2017-07-30 DIAGNOSIS — R52 Pain, unspecified: Secondary | ICD-10-CM | POA: Diagnosis not present

## 2017-07-30 DIAGNOSIS — I2510 Atherosclerotic heart disease of native coronary artery without angina pectoris: Secondary | ICD-10-CM | POA: Diagnosis not present

## 2017-07-30 DIAGNOSIS — M25571 Pain in right ankle and joints of right foot: Secondary | ICD-10-CM | POA: Diagnosis not present

## 2017-07-30 DIAGNOSIS — M79671 Pain in right foot: Secondary | ICD-10-CM | POA: Diagnosis not present

## 2017-08-30 DIAGNOSIS — S3210XD Unspecified fracture of sacrum, subsequent encounter for fracture with routine healing: Secondary | ICD-10-CM | POA: Diagnosis not present

## 2017-08-30 DIAGNOSIS — R52 Pain, unspecified: Secondary | ICD-10-CM | POA: Diagnosis not present

## 2017-08-30 DIAGNOSIS — I2510 Atherosclerotic heart disease of native coronary artery without angina pectoris: Secondary | ICD-10-CM | POA: Diagnosis not present

## 2017-08-30 DIAGNOSIS — M25571 Pain in right ankle and joints of right foot: Secondary | ICD-10-CM | POA: Diagnosis not present

## 2017-08-30 DIAGNOSIS — M79671 Pain in right foot: Secondary | ICD-10-CM | POA: Diagnosis not present

## 2017-09-25 DIAGNOSIS — Z23 Encounter for immunization: Secondary | ICD-10-CM | POA: Diagnosis not present

## 2017-09-25 DIAGNOSIS — E669 Obesity, unspecified: Secondary | ICD-10-CM | POA: Diagnosis not present

## 2017-09-25 DIAGNOSIS — E7800 Pure hypercholesterolemia, unspecified: Secondary | ICD-10-CM | POA: Diagnosis not present

## 2017-09-25 DIAGNOSIS — M8580 Other specified disorders of bone density and structure, unspecified site: Secondary | ICD-10-CM | POA: Diagnosis not present

## 2017-09-25 DIAGNOSIS — Z0000 Encounter for general adult medical examination without abnormal findings: Secondary | ICD-10-CM | POA: Diagnosis not present

## 2017-09-29 DIAGNOSIS — M79671 Pain in right foot: Secondary | ICD-10-CM | POA: Diagnosis not present

## 2017-09-29 DIAGNOSIS — M25571 Pain in right ankle and joints of right foot: Secondary | ICD-10-CM | POA: Diagnosis not present

## 2017-09-29 DIAGNOSIS — S3210XD Unspecified fracture of sacrum, subsequent encounter for fracture with routine healing: Secondary | ICD-10-CM | POA: Diagnosis not present

## 2017-09-29 DIAGNOSIS — R52 Pain, unspecified: Secondary | ICD-10-CM | POA: Diagnosis not present

## 2017-09-29 DIAGNOSIS — I2510 Atherosclerotic heart disease of native coronary artery without angina pectoris: Secondary | ICD-10-CM | POA: Diagnosis not present

## 2017-10-30 DIAGNOSIS — I2510 Atherosclerotic heart disease of native coronary artery without angina pectoris: Secondary | ICD-10-CM | POA: Diagnosis not present

## 2017-10-30 DIAGNOSIS — M79671 Pain in right foot: Secondary | ICD-10-CM | POA: Diagnosis not present

## 2017-10-30 DIAGNOSIS — S3210XD Unspecified fracture of sacrum, subsequent encounter for fracture with routine healing: Secondary | ICD-10-CM | POA: Diagnosis not present

## 2017-10-30 DIAGNOSIS — R52 Pain, unspecified: Secondary | ICD-10-CM | POA: Diagnosis not present

## 2017-10-30 DIAGNOSIS — M25571 Pain in right ankle and joints of right foot: Secondary | ICD-10-CM | POA: Diagnosis not present

## 2017-11-30 DIAGNOSIS — I2510 Atherosclerotic heart disease of native coronary artery without angina pectoris: Secondary | ICD-10-CM | POA: Diagnosis not present

## 2017-11-30 DIAGNOSIS — S3210XD Unspecified fracture of sacrum, subsequent encounter for fracture with routine healing: Secondary | ICD-10-CM | POA: Diagnosis not present

## 2017-11-30 DIAGNOSIS — M79671 Pain in right foot: Secondary | ICD-10-CM | POA: Diagnosis not present

## 2017-11-30 DIAGNOSIS — M25571 Pain in right ankle and joints of right foot: Secondary | ICD-10-CM | POA: Diagnosis not present

## 2017-11-30 DIAGNOSIS — R52 Pain, unspecified: Secondary | ICD-10-CM | POA: Diagnosis not present

## 2017-12-28 DIAGNOSIS — S3210XD Unspecified fracture of sacrum, subsequent encounter for fracture with routine healing: Secondary | ICD-10-CM | POA: Diagnosis not present

## 2017-12-28 DIAGNOSIS — I2510 Atherosclerotic heart disease of native coronary artery without angina pectoris: Secondary | ICD-10-CM | POA: Diagnosis not present

## 2017-12-28 DIAGNOSIS — M79671 Pain in right foot: Secondary | ICD-10-CM | POA: Diagnosis not present

## 2017-12-28 DIAGNOSIS — R52 Pain, unspecified: Secondary | ICD-10-CM | POA: Diagnosis not present

## 2017-12-28 DIAGNOSIS — M25571 Pain in right ankle and joints of right foot: Secondary | ICD-10-CM | POA: Diagnosis not present

## 2018-01-06 DIAGNOSIS — H01024 Squamous blepharitis left upper eyelid: Secondary | ICD-10-CM | POA: Diagnosis not present

## 2018-01-06 DIAGNOSIS — H04123 Dry eye syndrome of bilateral lacrimal glands: Secondary | ICD-10-CM | POA: Diagnosis not present

## 2018-01-06 DIAGNOSIS — H01025 Squamous blepharitis left lower eyelid: Secondary | ICD-10-CM | POA: Diagnosis not present

## 2018-01-06 DIAGNOSIS — H43393 Other vitreous opacities, bilateral: Secondary | ICD-10-CM | POA: Diagnosis not present

## 2018-01-06 DIAGNOSIS — D23121 Other benign neoplasm of skin of left upper eyelid, including canthus: Secondary | ICD-10-CM | POA: Diagnosis not present

## 2018-01-06 DIAGNOSIS — Z961 Presence of intraocular lens: Secondary | ICD-10-CM | POA: Diagnosis not present

## 2018-01-06 DIAGNOSIS — H01022 Squamous blepharitis right lower eyelid: Secondary | ICD-10-CM | POA: Diagnosis not present

## 2018-01-06 DIAGNOSIS — H01021 Squamous blepharitis right upper eyelid: Secondary | ICD-10-CM | POA: Diagnosis not present

## 2018-01-28 DIAGNOSIS — M79671 Pain in right foot: Secondary | ICD-10-CM | POA: Diagnosis not present

## 2018-01-28 DIAGNOSIS — S3210XD Unspecified fracture of sacrum, subsequent encounter for fracture with routine healing: Secondary | ICD-10-CM | POA: Diagnosis not present

## 2018-01-28 DIAGNOSIS — M25571 Pain in right ankle and joints of right foot: Secondary | ICD-10-CM | POA: Diagnosis not present

## 2018-01-28 DIAGNOSIS — I2510 Atherosclerotic heart disease of native coronary artery without angina pectoris: Secondary | ICD-10-CM | POA: Diagnosis not present

## 2018-01-28 DIAGNOSIS — R52 Pain, unspecified: Secondary | ICD-10-CM | POA: Diagnosis not present

## 2018-02-27 DIAGNOSIS — M25571 Pain in right ankle and joints of right foot: Secondary | ICD-10-CM | POA: Diagnosis not present

## 2018-02-27 DIAGNOSIS — M79671 Pain in right foot: Secondary | ICD-10-CM | POA: Diagnosis not present

## 2018-02-27 DIAGNOSIS — S3210XD Unspecified fracture of sacrum, subsequent encounter for fracture with routine healing: Secondary | ICD-10-CM | POA: Diagnosis not present

## 2018-02-27 DIAGNOSIS — R52 Pain, unspecified: Secondary | ICD-10-CM | POA: Diagnosis not present

## 2018-02-27 DIAGNOSIS — I2510 Atherosclerotic heart disease of native coronary artery without angina pectoris: Secondary | ICD-10-CM | POA: Diagnosis not present

## 2018-03-02 DIAGNOSIS — C44311 Basal cell carcinoma of skin of nose: Secondary | ICD-10-CM | POA: Diagnosis not present

## 2018-03-02 DIAGNOSIS — D485 Neoplasm of uncertain behavior of skin: Secondary | ICD-10-CM | POA: Diagnosis not present

## 2018-03-02 DIAGNOSIS — L821 Other seborrheic keratosis: Secondary | ICD-10-CM | POA: Diagnosis not present

## 2018-03-30 DIAGNOSIS — Z85828 Personal history of other malignant neoplasm of skin: Secondary | ICD-10-CM | POA: Diagnosis not present

## 2018-03-30 DIAGNOSIS — R52 Pain, unspecified: Secondary | ICD-10-CM | POA: Diagnosis not present

## 2018-03-30 DIAGNOSIS — C44311 Basal cell carcinoma of skin of nose: Secondary | ICD-10-CM | POA: Diagnosis not present

## 2018-03-30 DIAGNOSIS — I2510 Atherosclerotic heart disease of native coronary artery without angina pectoris: Secondary | ICD-10-CM | POA: Diagnosis not present

## 2018-03-30 DIAGNOSIS — S3210XD Unspecified fracture of sacrum, subsequent encounter for fracture with routine healing: Secondary | ICD-10-CM | POA: Diagnosis not present

## 2018-03-30 DIAGNOSIS — M79671 Pain in right foot: Secondary | ICD-10-CM | POA: Diagnosis not present

## 2018-03-30 DIAGNOSIS — M25571 Pain in right ankle and joints of right foot: Secondary | ICD-10-CM | POA: Diagnosis not present

## 2018-04-29 DIAGNOSIS — M79671 Pain in right foot: Secondary | ICD-10-CM | POA: Diagnosis not present

## 2018-04-29 DIAGNOSIS — I2510 Atherosclerotic heart disease of native coronary artery without angina pectoris: Secondary | ICD-10-CM | POA: Diagnosis not present

## 2018-04-29 DIAGNOSIS — S3210XD Unspecified fracture of sacrum, subsequent encounter for fracture with routine healing: Secondary | ICD-10-CM | POA: Diagnosis not present

## 2018-04-29 DIAGNOSIS — M25571 Pain in right ankle and joints of right foot: Secondary | ICD-10-CM | POA: Diagnosis not present

## 2018-04-29 DIAGNOSIS — R52 Pain, unspecified: Secondary | ICD-10-CM | POA: Diagnosis not present

## 2018-05-30 DIAGNOSIS — S3210XD Unspecified fracture of sacrum, subsequent encounter for fracture with routine healing: Secondary | ICD-10-CM | POA: Diagnosis not present

## 2018-05-30 DIAGNOSIS — R52 Pain, unspecified: Secondary | ICD-10-CM | POA: Diagnosis not present

## 2018-05-30 DIAGNOSIS — I2510 Atherosclerotic heart disease of native coronary artery without angina pectoris: Secondary | ICD-10-CM | POA: Diagnosis not present

## 2018-05-30 DIAGNOSIS — M79671 Pain in right foot: Secondary | ICD-10-CM | POA: Diagnosis not present

## 2018-05-30 DIAGNOSIS — M25571 Pain in right ankle and joints of right foot: Secondary | ICD-10-CM | POA: Diagnosis not present

## 2018-06-30 DIAGNOSIS — M79671 Pain in right foot: Secondary | ICD-10-CM | POA: Diagnosis not present

## 2018-06-30 DIAGNOSIS — S3210XD Unspecified fracture of sacrum, subsequent encounter for fracture with routine healing: Secondary | ICD-10-CM | POA: Diagnosis not present

## 2018-06-30 DIAGNOSIS — M25571 Pain in right ankle and joints of right foot: Secondary | ICD-10-CM | POA: Diagnosis not present

## 2018-06-30 DIAGNOSIS — R52 Pain, unspecified: Secondary | ICD-10-CM | POA: Diagnosis not present

## 2018-06-30 DIAGNOSIS — I2510 Atherosclerotic heart disease of native coronary artery without angina pectoris: Secondary | ICD-10-CM | POA: Diagnosis not present

## 2018-07-16 DIAGNOSIS — H02135 Senile ectropion of left lower eyelid: Secondary | ICD-10-CM | POA: Diagnosis not present

## 2018-07-16 DIAGNOSIS — H0220C Unspecified lagophthalmos, bilateral, upper and lower eyelids: Secondary | ICD-10-CM | POA: Diagnosis not present

## 2018-07-16 DIAGNOSIS — H16212 Exposure keratoconjunctivitis, left eye: Secondary | ICD-10-CM | POA: Diagnosis not present

## 2018-07-23 DIAGNOSIS — H02135 Senile ectropion of left lower eyelid: Secondary | ICD-10-CM | POA: Diagnosis not present

## 2018-07-23 DIAGNOSIS — H16212 Exposure keratoconjunctivitis, left eye: Secondary | ICD-10-CM | POA: Diagnosis not present

## 2018-07-23 DIAGNOSIS — H16142 Punctate keratitis, left eye: Secondary | ICD-10-CM | POA: Diagnosis not present

## 2018-07-30 DIAGNOSIS — M25571 Pain in right ankle and joints of right foot: Secondary | ICD-10-CM | POA: Diagnosis not present

## 2018-07-30 DIAGNOSIS — M79671 Pain in right foot: Secondary | ICD-10-CM | POA: Diagnosis not present

## 2018-07-30 DIAGNOSIS — R52 Pain, unspecified: Secondary | ICD-10-CM | POA: Diagnosis not present

## 2018-07-30 DIAGNOSIS — S3210XD Unspecified fracture of sacrum, subsequent encounter for fracture with routine healing: Secondary | ICD-10-CM | POA: Diagnosis not present

## 2018-07-30 DIAGNOSIS — I2510 Atherosclerotic heart disease of native coronary artery without angina pectoris: Secondary | ICD-10-CM | POA: Diagnosis not present

## 2018-08-03 DIAGNOSIS — R69 Illness, unspecified: Secondary | ICD-10-CM | POA: Diagnosis not present

## 2018-08-30 DIAGNOSIS — R52 Pain, unspecified: Secondary | ICD-10-CM | POA: Diagnosis not present

## 2018-08-30 DIAGNOSIS — S3210XD Unspecified fracture of sacrum, subsequent encounter for fracture with routine healing: Secondary | ICD-10-CM | POA: Diagnosis not present

## 2018-08-30 DIAGNOSIS — I2510 Atherosclerotic heart disease of native coronary artery without angina pectoris: Secondary | ICD-10-CM | POA: Diagnosis not present

## 2018-08-30 DIAGNOSIS — M25571 Pain in right ankle and joints of right foot: Secondary | ICD-10-CM | POA: Diagnosis not present

## 2018-08-30 DIAGNOSIS — M79671 Pain in right foot: Secondary | ICD-10-CM | POA: Diagnosis not present

## 2018-09-29 DIAGNOSIS — S3210XD Unspecified fracture of sacrum, subsequent encounter for fracture with routine healing: Secondary | ICD-10-CM | POA: Diagnosis not present

## 2018-09-29 DIAGNOSIS — R52 Pain, unspecified: Secondary | ICD-10-CM | POA: Diagnosis not present

## 2018-09-29 DIAGNOSIS — M25571 Pain in right ankle and joints of right foot: Secondary | ICD-10-CM | POA: Diagnosis not present

## 2018-09-29 DIAGNOSIS — I2510 Atherosclerotic heart disease of native coronary artery without angina pectoris: Secondary | ICD-10-CM | POA: Diagnosis not present

## 2018-09-29 DIAGNOSIS — M79671 Pain in right foot: Secondary | ICD-10-CM | POA: Diagnosis not present

## 2018-10-30 DIAGNOSIS — M79671 Pain in right foot: Secondary | ICD-10-CM | POA: Diagnosis not present

## 2018-10-30 DIAGNOSIS — R52 Pain, unspecified: Secondary | ICD-10-CM | POA: Diagnosis not present

## 2018-10-30 DIAGNOSIS — I2510 Atherosclerotic heart disease of native coronary artery without angina pectoris: Secondary | ICD-10-CM | POA: Diagnosis not present

## 2018-10-30 DIAGNOSIS — S3210XD Unspecified fracture of sacrum, subsequent encounter for fracture with routine healing: Secondary | ICD-10-CM | POA: Diagnosis not present

## 2018-10-30 DIAGNOSIS — M25571 Pain in right ankle and joints of right foot: Secondary | ICD-10-CM | POA: Diagnosis not present

## 2018-11-09 DIAGNOSIS — H04123 Dry eye syndrome of bilateral lacrimal glands: Secondary | ICD-10-CM | POA: Diagnosis not present

## 2018-11-09 DIAGNOSIS — H01134 Eczematous dermatitis of left upper eyelid: Secondary | ICD-10-CM | POA: Diagnosis not present

## 2018-11-09 DIAGNOSIS — H0102A Squamous blepharitis right eye, upper and lower eyelids: Secondary | ICD-10-CM | POA: Diagnosis not present

## 2018-11-09 DIAGNOSIS — H01131 Eczematous dermatitis of right upper eyelid: Secondary | ICD-10-CM | POA: Diagnosis not present

## 2018-11-09 DIAGNOSIS — H0102B Squamous blepharitis left eye, upper and lower eyelids: Secondary | ICD-10-CM | POA: Diagnosis not present

## 2018-11-09 DIAGNOSIS — Z961 Presence of intraocular lens: Secondary | ICD-10-CM | POA: Diagnosis not present

## 2018-11-30 DIAGNOSIS — I2510 Atherosclerotic heart disease of native coronary artery without angina pectoris: Secondary | ICD-10-CM | POA: Diagnosis not present

## 2018-11-30 DIAGNOSIS — S3210XD Unspecified fracture of sacrum, subsequent encounter for fracture with routine healing: Secondary | ICD-10-CM | POA: Diagnosis not present

## 2018-11-30 DIAGNOSIS — R52 Pain, unspecified: Secondary | ICD-10-CM | POA: Diagnosis not present

## 2018-11-30 DIAGNOSIS — M25571 Pain in right ankle and joints of right foot: Secondary | ICD-10-CM | POA: Diagnosis not present

## 2018-11-30 DIAGNOSIS — M79671 Pain in right foot: Secondary | ICD-10-CM | POA: Diagnosis not present

## 2019-02-24 DIAGNOSIS — R7309 Other abnormal glucose: Secondary | ICD-10-CM | POA: Diagnosis not present

## 2019-02-24 DIAGNOSIS — E669 Obesity, unspecified: Secondary | ICD-10-CM | POA: Diagnosis not present

## 2019-02-24 DIAGNOSIS — Z0000 Encounter for general adult medical examination without abnormal findings: Secondary | ICD-10-CM | POA: Diagnosis not present

## 2019-02-24 DIAGNOSIS — M8580 Other specified disorders of bone density and structure, unspecified site: Secondary | ICD-10-CM | POA: Diagnosis not present

## 2019-02-25 DIAGNOSIS — R7309 Other abnormal glucose: Secondary | ICD-10-CM | POA: Diagnosis not present

## 2019-07-08 DIAGNOSIS — R69 Illness, unspecified: Secondary | ICD-10-CM | POA: Diagnosis not present

## 2019-12-04 DIAGNOSIS — Z03818 Encounter for observation for suspected exposure to other biological agents ruled out: Secondary | ICD-10-CM | POA: Diagnosis not present

## 2019-12-04 DIAGNOSIS — Z20828 Contact with and (suspected) exposure to other viral communicable diseases: Secondary | ICD-10-CM | POA: Diagnosis not present

## 2020-02-27 DIAGNOSIS — Z0000 Encounter for general adult medical examination without abnormal findings: Secondary | ICD-10-CM | POA: Diagnosis not present

## 2020-02-27 DIAGNOSIS — M8580 Other specified disorders of bone density and structure, unspecified site: Secondary | ICD-10-CM | POA: Diagnosis not present

## 2020-02-27 DIAGNOSIS — E669 Obesity, unspecified: Secondary | ICD-10-CM | POA: Diagnosis not present

## 2020-04-26 DIAGNOSIS — R69 Illness, unspecified: Secondary | ICD-10-CM | POA: Diagnosis not present

## 2020-05-02 DIAGNOSIS — R69 Illness, unspecified: Secondary | ICD-10-CM | POA: Diagnosis not present

## 2020-05-08 DIAGNOSIS — R69 Illness, unspecified: Secondary | ICD-10-CM | POA: Diagnosis not present

## 2020-05-21 DIAGNOSIS — R69 Illness, unspecified: Secondary | ICD-10-CM | POA: Diagnosis not present

## 2020-05-22 DIAGNOSIS — H02035 Senile entropion of left lower eyelid: Secondary | ICD-10-CM | POA: Diagnosis not present

## 2020-05-22 DIAGNOSIS — D3102 Benign neoplasm of left conjunctiva: Secondary | ICD-10-CM | POA: Diagnosis not present

## 2020-05-22 DIAGNOSIS — H35373 Puckering of macula, bilateral: Secondary | ICD-10-CM | POA: Diagnosis not present

## 2020-06-12 DIAGNOSIS — H02035 Senile entropion of left lower eyelid: Secondary | ICD-10-CM | POA: Diagnosis not present

## 2020-06-12 DIAGNOSIS — H43822 Vitreomacular adhesion, left eye: Secondary | ICD-10-CM | POA: Diagnosis not present

## 2020-06-12 DIAGNOSIS — H16102 Unspecified superficial keratitis, left eye: Secondary | ICD-10-CM | POA: Diagnosis not present

## 2020-06-15 DIAGNOSIS — Z20822 Contact with and (suspected) exposure to covid-19: Secondary | ICD-10-CM | POA: Diagnosis not present

## 2020-06-19 DIAGNOSIS — H16102 Unspecified superficial keratitis, left eye: Secondary | ICD-10-CM | POA: Diagnosis not present

## 2020-06-19 DIAGNOSIS — H02035 Senile entropion of left lower eyelid: Secondary | ICD-10-CM | POA: Diagnosis not present

## 2020-07-27 DIAGNOSIS — Z20822 Contact with and (suspected) exposure to covid-19: Secondary | ICD-10-CM | POA: Diagnosis not present

## 2020-07-31 DIAGNOSIS — U071 COVID-19: Secondary | ICD-10-CM | POA: Diagnosis not present

## 2020-08-13 DIAGNOSIS — Z20822 Contact with and (suspected) exposure to covid-19: Secondary | ICD-10-CM | POA: Diagnosis not present

## 2021-02-18 DIAGNOSIS — R413 Other amnesia: Secondary | ICD-10-CM | POA: Diagnosis not present

## 2021-02-18 DIAGNOSIS — R42 Dizziness and giddiness: Secondary | ICD-10-CM | POA: Diagnosis not present

## 2021-02-19 DIAGNOSIS — Z1231 Encounter for screening mammogram for malignant neoplasm of breast: Secondary | ICD-10-CM | POA: Diagnosis not present

## 2021-02-27 DIAGNOSIS — Z0000 Encounter for general adult medical examination without abnormal findings: Secondary | ICD-10-CM | POA: Diagnosis not present

## 2021-02-27 DIAGNOSIS — E669 Obesity, unspecified: Secondary | ICD-10-CM | POA: Diagnosis not present

## 2021-02-27 DIAGNOSIS — M8580 Other specified disorders of bone density and structure, unspecified site: Secondary | ICD-10-CM | POA: Diagnosis not present

## 2021-02-27 DIAGNOSIS — Z23 Encounter for immunization: Secondary | ICD-10-CM | POA: Diagnosis not present

## 2021-03-07 DIAGNOSIS — R921 Mammographic calcification found on diagnostic imaging of breast: Secondary | ICD-10-CM | POA: Diagnosis not present

## 2021-03-07 DIAGNOSIS — R928 Other abnormal and inconclusive findings on diagnostic imaging of breast: Secondary | ICD-10-CM | POA: Diagnosis not present

## 2021-12-27 DIAGNOSIS — H16102 Unspecified superficial keratitis, left eye: Secondary | ICD-10-CM | POA: Diagnosis not present

## 2021-12-27 DIAGNOSIS — H43822 Vitreomacular adhesion, left eye: Secondary | ICD-10-CM | POA: Diagnosis not present

## 2022-04-11 DIAGNOSIS — Z1231 Encounter for screening mammogram for malignant neoplasm of breast: Secondary | ICD-10-CM | POA: Diagnosis not present

## 2022-04-23 DIAGNOSIS — E2839 Other primary ovarian failure: Secondary | ICD-10-CM | POA: Diagnosis not present

## 2022-04-23 DIAGNOSIS — Z0000 Encounter for general adult medical examination without abnormal findings: Secondary | ICD-10-CM | POA: Diagnosis not present

## 2022-04-23 DIAGNOSIS — M8580 Other specified disorders of bone density and structure, unspecified site: Secondary | ICD-10-CM | POA: Diagnosis not present

## 2022-04-23 DIAGNOSIS — E669 Obesity, unspecified: Secondary | ICD-10-CM | POA: Diagnosis not present

## 2022-04-25 DIAGNOSIS — M8589 Other specified disorders of bone density and structure, multiple sites: Secondary | ICD-10-CM | POA: Diagnosis not present

## 2022-04-25 DIAGNOSIS — Z780 Asymptomatic menopausal state: Secondary | ICD-10-CM | POA: Diagnosis not present

## 2022-06-04 DIAGNOSIS — H26492 Other secondary cataract, left eye: Secondary | ICD-10-CM | POA: Diagnosis not present

## 2022-07-09 DIAGNOSIS — H40011 Open angle with borderline findings, low risk, right eye: Secondary | ICD-10-CM | POA: Diagnosis not present

## 2022-07-09 DIAGNOSIS — H35373 Puckering of macula, bilateral: Secondary | ICD-10-CM | POA: Diagnosis not present

## 2022-07-09 DIAGNOSIS — H40013 Open angle with borderline findings, low risk, bilateral: Secondary | ICD-10-CM | POA: Diagnosis not present

## 2022-10-02 DIAGNOSIS — H524 Presbyopia: Secondary | ICD-10-CM | POA: Diagnosis not present

## 2022-10-02 DIAGNOSIS — H52222 Regular astigmatism, left eye: Secondary | ICD-10-CM | POA: Diagnosis not present

## 2022-11-12 DIAGNOSIS — H353131 Nonexudative age-related macular degeneration, bilateral, early dry stage: Secondary | ICD-10-CM | POA: Diagnosis not present

## 2022-11-12 DIAGNOSIS — C44101 Unspecified malignant neoplasm of skin of unspecified eyelid, including canthus: Secondary | ICD-10-CM | POA: Diagnosis not present

## 2022-11-12 DIAGNOSIS — H40013 Open angle with borderline findings, low risk, bilateral: Secondary | ICD-10-CM | POA: Diagnosis not present

## 2022-11-12 DIAGNOSIS — H43823 Vitreomacular adhesion, bilateral: Secondary | ICD-10-CM | POA: Diagnosis not present

## 2022-11-12 DIAGNOSIS — H35373 Puckering of macula, bilateral: Secondary | ICD-10-CM | POA: Diagnosis not present

## 2022-11-17 DIAGNOSIS — C441192 Basal cell carcinoma of skin of left lower eyelid, including canthus: Secondary | ICD-10-CM | POA: Diagnosis not present

## 2022-11-18 DIAGNOSIS — D485 Neoplasm of uncertain behavior of skin: Secondary | ICD-10-CM | POA: Diagnosis not present

## 2022-11-18 DIAGNOSIS — H04202 Unspecified epiphora, left lacrimal gland: Secondary | ICD-10-CM | POA: Diagnosis not present

## 2022-12-26 DIAGNOSIS — D485 Neoplasm of uncertain behavior of skin: Secondary | ICD-10-CM | POA: Diagnosis not present

## 2022-12-26 DIAGNOSIS — Z01818 Encounter for other preprocedural examination: Secondary | ICD-10-CM | POA: Diagnosis not present

## 2022-12-26 DIAGNOSIS — H04202 Unspecified epiphora, left lacrimal gland: Secondary | ICD-10-CM | POA: Diagnosis not present

## 2023-01-15 DIAGNOSIS — C441191 Basal cell carcinoma of skin of left upper eyelid, including canthus: Secondary | ICD-10-CM | POA: Diagnosis not present

## 2023-01-16 DIAGNOSIS — Z481 Encounter for planned postprocedural wound closure: Secondary | ICD-10-CM | POA: Diagnosis not present

## 2023-01-16 DIAGNOSIS — Z483 Aftercare following surgery for neoplasm: Secondary | ICD-10-CM | POA: Diagnosis not present

## 2023-01-16 DIAGNOSIS — Z85828 Personal history of other malignant neoplasm of skin: Secondary | ICD-10-CM | POA: Diagnosis not present

## 2023-01-16 DIAGNOSIS — C441191 Basal cell carcinoma of skin of left upper eyelid, including canthus: Secondary | ICD-10-CM | POA: Diagnosis not present

## 2023-04-24 DIAGNOSIS — Z1231 Encounter for screening mammogram for malignant neoplasm of breast: Secondary | ICD-10-CM | POA: Diagnosis not present

## 2023-04-30 DIAGNOSIS — Z23 Encounter for immunization: Secondary | ICD-10-CM | POA: Diagnosis not present

## 2023-04-30 DIAGNOSIS — M8588 Other specified disorders of bone density and structure, other site: Secondary | ICD-10-CM | POA: Diagnosis not present

## 2023-04-30 DIAGNOSIS — R011 Cardiac murmur, unspecified: Secondary | ICD-10-CM | POA: Diagnosis not present

## 2023-04-30 DIAGNOSIS — E7800 Pure hypercholesterolemia, unspecified: Secondary | ICD-10-CM | POA: Diagnosis not present

## 2023-04-30 DIAGNOSIS — R351 Nocturia: Secondary | ICD-10-CM | POA: Diagnosis not present

## 2023-04-30 DIAGNOSIS — Z0000 Encounter for general adult medical examination without abnormal findings: Secondary | ICD-10-CM | POA: Diagnosis not present

## 2023-04-30 DIAGNOSIS — E669 Obesity, unspecified: Secondary | ICD-10-CM | POA: Diagnosis not present

## 2023-07-06 DIAGNOSIS — R531 Weakness: Secondary | ICD-10-CM | POA: Diagnosis not present

## 2023-07-15 DIAGNOSIS — H01134 Eczematous dermatitis of left upper eyelid: Secondary | ICD-10-CM | POA: Diagnosis not present

## 2023-07-15 DIAGNOSIS — Z961 Presence of intraocular lens: Secondary | ICD-10-CM | POA: Diagnosis not present

## 2023-07-15 DIAGNOSIS — H26492 Other secondary cataract, left eye: Secondary | ICD-10-CM | POA: Diagnosis not present

## 2023-07-15 DIAGNOSIS — H35373 Puckering of macula, bilateral: Secondary | ICD-10-CM | POA: Diagnosis not present

## 2023-07-15 DIAGNOSIS — H01131 Eczematous dermatitis of right upper eyelid: Secondary | ICD-10-CM | POA: Diagnosis not present

## 2023-07-15 DIAGNOSIS — H0102A Squamous blepharitis right eye, upper and lower eyelids: Secondary | ICD-10-CM | POA: Diagnosis not present

## 2023-07-15 DIAGNOSIS — H04123 Dry eye syndrome of bilateral lacrimal glands: Secondary | ICD-10-CM | POA: Diagnosis not present

## 2023-07-15 DIAGNOSIS — H43811 Vitreous degeneration, right eye: Secondary | ICD-10-CM | POA: Diagnosis not present

## 2023-07-15 DIAGNOSIS — H43822 Vitreomacular adhesion, left eye: Secondary | ICD-10-CM | POA: Diagnosis not present

## 2023-07-15 DIAGNOSIS — H0102B Squamous blepharitis left eye, upper and lower eyelids: Secondary | ICD-10-CM | POA: Diagnosis not present

## 2023-07-29 DIAGNOSIS — H35373 Puckering of macula, bilateral: Secondary | ICD-10-CM | POA: Diagnosis not present

## 2023-07-29 DIAGNOSIS — H43822 Vitreomacular adhesion, left eye: Secondary | ICD-10-CM | POA: Diagnosis not present

## 2023-07-29 DIAGNOSIS — H353131 Nonexudative age-related macular degeneration, bilateral, early dry stage: Secondary | ICD-10-CM | POA: Diagnosis not present

## 2023-07-29 DIAGNOSIS — H43813 Vitreous degeneration, bilateral: Secondary | ICD-10-CM | POA: Diagnosis not present

## 2023-07-29 DIAGNOSIS — H04123 Dry eye syndrome of bilateral lacrimal glands: Secondary | ICD-10-CM | POA: Diagnosis not present

## 2023-08-19 DIAGNOSIS — H35372 Puckering of macula, left eye: Secondary | ICD-10-CM | POA: Diagnosis not present

## 2023-08-26 DIAGNOSIS — H35373 Puckering of macula, bilateral: Secondary | ICD-10-CM | POA: Diagnosis not present

## 2023-08-26 DIAGNOSIS — H43812 Vitreous degeneration, left eye: Secondary | ICD-10-CM | POA: Diagnosis not present

## 2023-08-26 DIAGNOSIS — H43822 Vitreomacular adhesion, left eye: Secondary | ICD-10-CM | POA: Diagnosis not present

## 2023-08-26 DIAGNOSIS — H43811 Vitreous degeneration, right eye: Secondary | ICD-10-CM | POA: Diagnosis not present

## 2023-08-26 DIAGNOSIS — H353131 Nonexudative age-related macular degeneration, bilateral, early dry stage: Secondary | ICD-10-CM | POA: Diagnosis not present

## 2023-08-26 DIAGNOSIS — H04123 Dry eye syndrome of bilateral lacrimal glands: Secondary | ICD-10-CM | POA: Diagnosis not present

## 2023-09-22 DIAGNOSIS — Z0100 Encounter for examination of eyes and vision without abnormal findings: Secondary | ICD-10-CM | POA: Diagnosis not present

## 2023-09-22 DIAGNOSIS — H35371 Puckering of macula, right eye: Secondary | ICD-10-CM | POA: Diagnosis not present

## 2023-10-27 DIAGNOSIS — M898X6 Other specified disorders of bone, lower leg: Secondary | ICD-10-CM | POA: Diagnosis not present

## 2023-10-27 DIAGNOSIS — M533 Sacrococcygeal disorders, not elsewhere classified: Secondary | ICD-10-CM | POA: Diagnosis not present

## 2023-10-30 ENCOUNTER — Other Ambulatory Visit: Payer: Self-pay | Admitting: Family Medicine

## 2023-10-30 ENCOUNTER — Ambulatory Visit: Payer: Self-pay

## 2023-10-30 DIAGNOSIS — M545 Low back pain, unspecified: Secondary | ICD-10-CM | POA: Diagnosis not present

## 2023-10-30 DIAGNOSIS — M5440 Lumbago with sciatica, unspecified side: Secondary | ICD-10-CM

## 2023-10-30 DIAGNOSIS — R7303 Prediabetes: Secondary | ICD-10-CM | POA: Diagnosis not present

## 2023-10-30 DIAGNOSIS — R7309 Other abnormal glucose: Secondary | ICD-10-CM | POA: Diagnosis not present

## 2023-10-30 DIAGNOSIS — E7800 Pure hypercholesterolemia, unspecified: Secondary | ICD-10-CM | POA: Diagnosis not present

## 2023-10-30 DIAGNOSIS — E669 Obesity, unspecified: Secondary | ICD-10-CM | POA: Diagnosis not present

## 2023-10-30 DIAGNOSIS — M8588 Other specified disorders of bone density and structure, other site: Secondary | ICD-10-CM | POA: Diagnosis not present

## 2023-10-30 DIAGNOSIS — E559 Vitamin D deficiency, unspecified: Secondary | ICD-10-CM | POA: Diagnosis not present

## 2023-11-03 ENCOUNTER — Ambulatory Visit
Admission: RE | Admit: 2023-11-03 | Discharge: 2023-11-03 | Disposition: A | Discharge status: Home / Self Care | Payer: Medicare HMO | Source: Ambulatory Visit | Attending: Family Medicine | Admitting: Family Medicine

## 2023-11-03 DIAGNOSIS — M545 Low back pain, unspecified: Secondary | ICD-10-CM

## 2023-11-03 DIAGNOSIS — M5440 Lumbago with sciatica, unspecified side: Secondary | ICD-10-CM

## 2023-11-03 DIAGNOSIS — M25552 Pain in left hip: Secondary | ICD-10-CM | POA: Diagnosis not present

## 2023-11-13 DIAGNOSIS — M9904 Segmental and somatic dysfunction of sacral region: Secondary | ICD-10-CM | POA: Diagnosis not present

## 2023-11-13 DIAGNOSIS — M51362 Other intervertebral disc degeneration, lumbar region with discogenic back pain and lower extremity pain: Secondary | ICD-10-CM | POA: Diagnosis not present

## 2023-11-13 DIAGNOSIS — M62830 Muscle spasm of back: Secondary | ICD-10-CM | POA: Diagnosis not present

## 2023-11-13 DIAGNOSIS — M9903 Segmental and somatic dysfunction of lumbar region: Secondary | ICD-10-CM | POA: Diagnosis not present

## 2023-11-13 DIAGNOSIS — M5417 Radiculopathy, lumbosacral region: Secondary | ICD-10-CM | POA: Diagnosis not present

## 2023-11-25 DIAGNOSIS — H04123 Dry eye syndrome of bilateral lacrimal glands: Secondary | ICD-10-CM | POA: Diagnosis not present

## 2023-11-25 DIAGNOSIS — H35373 Puckering of macula, bilateral: Secondary | ICD-10-CM | POA: Diagnosis not present

## 2023-11-25 DIAGNOSIS — H43811 Vitreous degeneration, right eye: Secondary | ICD-10-CM | POA: Diagnosis not present

## 2023-11-25 DIAGNOSIS — H35352 Cystoid macular degeneration, left eye: Secondary | ICD-10-CM | POA: Diagnosis not present

## 2023-11-25 DIAGNOSIS — H43822 Vitreomacular adhesion, left eye: Secondary | ICD-10-CM | POA: Diagnosis not present

## 2023-11-25 DIAGNOSIS — H353131 Nonexudative age-related macular degeneration, bilateral, early dry stage: Secondary | ICD-10-CM | POA: Diagnosis not present

## 2023-11-25 DIAGNOSIS — H43812 Vitreous degeneration, left eye: Secondary | ICD-10-CM | POA: Diagnosis not present

## 2023-11-27 DIAGNOSIS — M51362 Other intervertebral disc degeneration, lumbar region with discogenic back pain and lower extremity pain: Secondary | ICD-10-CM | POA: Diagnosis not present

## 2023-11-27 DIAGNOSIS — M62830 Muscle spasm of back: Secondary | ICD-10-CM | POA: Diagnosis not present

## 2023-11-27 DIAGNOSIS — M9904 Segmental and somatic dysfunction of sacral region: Secondary | ICD-10-CM | POA: Diagnosis not present

## 2023-11-27 DIAGNOSIS — M5417 Radiculopathy, lumbosacral region: Secondary | ICD-10-CM | POA: Diagnosis not present

## 2023-11-27 DIAGNOSIS — M9903 Segmental and somatic dysfunction of lumbar region: Secondary | ICD-10-CM | POA: Diagnosis not present

## 2023-12-03 DIAGNOSIS — M5417 Radiculopathy, lumbosacral region: Secondary | ICD-10-CM | POA: Diagnosis not present

## 2023-12-03 DIAGNOSIS — M51362 Other intervertebral disc degeneration, lumbar region with discogenic back pain and lower extremity pain: Secondary | ICD-10-CM | POA: Diagnosis not present

## 2023-12-03 DIAGNOSIS — M62830 Muscle spasm of back: Secondary | ICD-10-CM | POA: Diagnosis not present

## 2023-12-03 DIAGNOSIS — M9903 Segmental and somatic dysfunction of lumbar region: Secondary | ICD-10-CM | POA: Diagnosis not present

## 2023-12-03 DIAGNOSIS — M9904 Segmental and somatic dysfunction of sacral region: Secondary | ICD-10-CM | POA: Diagnosis not present

## 2023-12-23 DIAGNOSIS — M62830 Muscle spasm of back: Secondary | ICD-10-CM | POA: Diagnosis not present

## 2023-12-23 DIAGNOSIS — M9904 Segmental and somatic dysfunction of sacral region: Secondary | ICD-10-CM | POA: Diagnosis not present

## 2023-12-23 DIAGNOSIS — M5417 Radiculopathy, lumbosacral region: Secondary | ICD-10-CM | POA: Diagnosis not present

## 2023-12-23 DIAGNOSIS — M51362 Other intervertebral disc degeneration, lumbar region with discogenic back pain and lower extremity pain: Secondary | ICD-10-CM | POA: Diagnosis not present

## 2023-12-23 DIAGNOSIS — M9903 Segmental and somatic dysfunction of lumbar region: Secondary | ICD-10-CM | POA: Diagnosis not present

## 2023-12-30 DIAGNOSIS — H04123 Dry eye syndrome of bilateral lacrimal glands: Secondary | ICD-10-CM | POA: Diagnosis not present

## 2023-12-30 DIAGNOSIS — H353131 Nonexudative age-related macular degeneration, bilateral, early dry stage: Secondary | ICD-10-CM | POA: Diagnosis not present

## 2023-12-30 DIAGNOSIS — H43823 Vitreomacular adhesion, bilateral: Secondary | ICD-10-CM | POA: Diagnosis not present

## 2023-12-30 DIAGNOSIS — H35373 Puckering of macula, bilateral: Secondary | ICD-10-CM | POA: Diagnosis not present

## 2023-12-30 DIAGNOSIS — H35352 Cystoid macular degeneration, left eye: Secondary | ICD-10-CM | POA: Diagnosis not present

## 2024-01-20 DIAGNOSIS — M9904 Segmental and somatic dysfunction of sacral region: Secondary | ICD-10-CM | POA: Diagnosis not present

## 2024-01-20 DIAGNOSIS — M9903 Segmental and somatic dysfunction of lumbar region: Secondary | ICD-10-CM | POA: Diagnosis not present

## 2024-01-20 DIAGNOSIS — M62830 Muscle spasm of back: Secondary | ICD-10-CM | POA: Diagnosis not present

## 2024-01-20 DIAGNOSIS — M51362 Other intervertebral disc degeneration, lumbar region with discogenic back pain and lower extremity pain: Secondary | ICD-10-CM | POA: Diagnosis not present

## 2024-01-20 DIAGNOSIS — M5417 Radiculopathy, lumbosacral region: Secondary | ICD-10-CM | POA: Diagnosis not present

## 2024-02-10 DIAGNOSIS — H43811 Vitreous degeneration, right eye: Secondary | ICD-10-CM | POA: Diagnosis not present

## 2024-02-10 DIAGNOSIS — H43822 Vitreomacular adhesion, left eye: Secondary | ICD-10-CM | POA: Diagnosis not present

## 2024-02-10 DIAGNOSIS — H35372 Puckering of macula, left eye: Secondary | ICD-10-CM | POA: Diagnosis not present

## 2024-02-10 DIAGNOSIS — H353131 Nonexudative age-related macular degeneration, bilateral, early dry stage: Secondary | ICD-10-CM | POA: Diagnosis not present

## 2024-02-10 DIAGNOSIS — H43812 Vitreous degeneration, left eye: Secondary | ICD-10-CM | POA: Diagnosis not present

## 2024-02-10 DIAGNOSIS — H04123 Dry eye syndrome of bilateral lacrimal glands: Secondary | ICD-10-CM | POA: Diagnosis not present

## 2024-02-10 DIAGNOSIS — H35352 Cystoid macular degeneration, left eye: Secondary | ICD-10-CM | POA: Diagnosis not present

## 2024-02-10 DIAGNOSIS — H35371 Puckering of macula, right eye: Secondary | ICD-10-CM | POA: Diagnosis not present

## 2024-02-24 DIAGNOSIS — M62830 Muscle spasm of back: Secondary | ICD-10-CM | POA: Diagnosis not present

## 2024-02-24 DIAGNOSIS — M9903 Segmental and somatic dysfunction of lumbar region: Secondary | ICD-10-CM | POA: Diagnosis not present

## 2024-02-24 DIAGNOSIS — M5417 Radiculopathy, lumbosacral region: Secondary | ICD-10-CM | POA: Diagnosis not present

## 2024-02-24 DIAGNOSIS — M9904 Segmental and somatic dysfunction of sacral region: Secondary | ICD-10-CM | POA: Diagnosis not present

## 2024-02-24 DIAGNOSIS — M51362 Other intervertebral disc degeneration, lumbar region with discogenic back pain and lower extremity pain: Secondary | ICD-10-CM | POA: Diagnosis not present
